# Patient Record
Sex: Male | Born: 1985 | Race: White | Hispanic: No | Smoking: Current every day smoker
Health system: Southern US, Community
[De-identification: ages and names within clinical notes are randomized; demographics above are authoritative.]

## PROBLEM LIST (undated history)

## (undated) DIAGNOSIS — F1010 Alcohol abuse, uncomplicated: Secondary | ICD-10-CM

## (undated) DIAGNOSIS — I214 Non-ST elevation (NSTEMI) myocardial infarction: Secondary | ICD-10-CM

## (undated) DIAGNOSIS — I5020 Unspecified systolic (congestive) heart failure: Secondary | ICD-10-CM

## (undated) HISTORY — PX: CARDIAC CATHETERIZATION: SHX172

## (undated) HISTORY — DX: Non-ST elevation (NSTEMI) myocardial infarction: I21.4

## (undated) HISTORY — DX: Unspecified systolic (congestive) heart failure: I50.20

---
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
## 2006-08-11 ENCOUNTER — Emergency Department (HOSPITAL_COMMUNITY): Admission: EM | Admit: 2006-08-11 | Discharge: 2006-08-11 | Payer: Self-pay | Admitting: Emergency Medicine

## 2007-06-29 ENCOUNTER — Emergency Department (HOSPITAL_COMMUNITY): Admission: EM | Admit: 2007-06-29 | Discharge: 2007-06-29 | Payer: Self-pay | Admitting: Emergency Medicine

## 2007-12-26 ENCOUNTER — Emergency Department (HOSPITAL_COMMUNITY): Admission: EM | Admit: 2007-12-26 | Discharge: 2007-12-26 | Payer: Self-pay | Admitting: Emergency Medicine

## 2009-07-18 ENCOUNTER — Emergency Department (HOSPITAL_COMMUNITY): Admission: EM | Admit: 2009-07-18 | Discharge: 2009-07-18 | Payer: Self-pay | Admitting: Emergency Medicine

## 2009-08-19 ENCOUNTER — Emergency Department (HOSPITAL_COMMUNITY): Admission: EM | Admit: 2009-08-19 | Discharge: 2009-08-19 | Payer: Self-pay | Admitting: Emergency Medicine

## 2009-09-07 ENCOUNTER — Ambulatory Visit: Payer: Self-pay | Admitting: Diagnostic Radiology

## 2009-09-07 ENCOUNTER — Emergency Department (HOSPITAL_BASED_OUTPATIENT_CLINIC_OR_DEPARTMENT_OTHER): Admission: EM | Admit: 2009-09-07 | Discharge: 2009-09-07 | Payer: Self-pay | Admitting: Emergency Medicine

## 2009-11-08 ENCOUNTER — Emergency Department (HOSPITAL_BASED_OUTPATIENT_CLINIC_OR_DEPARTMENT_OTHER): Admission: EM | Admit: 2009-11-08 | Discharge: 2009-11-08 | Payer: Self-pay | Admitting: Emergency Medicine

## 2009-12-14 ENCOUNTER — Emergency Department (HOSPITAL_BASED_OUTPATIENT_CLINIC_OR_DEPARTMENT_OTHER): Admission: EM | Admit: 2009-12-14 | Discharge: 2009-12-14 | Payer: Self-pay | Admitting: Emergency Medicine

## 2011-09-23 LAB — CULTURE, ROUTINE-ABSCESS

## 2014-12-09 ENCOUNTER — Encounter (HOSPITAL_COMMUNITY): Payer: Self-pay | Admitting: *Deleted

## 2014-12-09 ENCOUNTER — Emergency Department (HOSPITAL_COMMUNITY)
Admission: EM | Admit: 2014-12-09 | Discharge: 2014-12-09 | Disposition: A | Discharge status: Home / Self Care | Payer: Self-pay | Attending: Emergency Medicine | Admitting: Emergency Medicine

## 2014-12-09 DIAGNOSIS — K029 Dental caries, unspecified: Secondary | ICD-10-CM | POA: Insufficient documentation

## 2014-12-09 DIAGNOSIS — K088 Other specified disorders of teeth and supporting structures: Secondary | ICD-10-CM | POA: Insufficient documentation

## 2014-12-09 DIAGNOSIS — Z791 Long term (current) use of non-steroidal anti-inflammatories (NSAID): Secondary | ICD-10-CM | POA: Insufficient documentation

## 2014-12-09 DIAGNOSIS — Z720 Tobacco use: Secondary | ICD-10-CM | POA: Insufficient documentation

## 2014-12-09 DIAGNOSIS — Z792 Long term (current) use of antibiotics: Secondary | ICD-10-CM | POA: Insufficient documentation

## 2014-12-09 DIAGNOSIS — K0889 Other specified disorders of teeth and supporting structures: Secondary | ICD-10-CM

## 2014-12-09 MED ORDER — PENICILLIN V POTASSIUM 250 MG PO TABS
500.0000 mg | ORAL_TABLET | Freq: Once | ORAL | Status: AC
  Administered 2014-12-09: 500 mg via ORAL
  Filled 2014-12-09: qty 2

## 2014-12-09 MED ORDER — IBUPROFEN 800 MG PO TABS
800.0000 mg | ORAL_TABLET | Freq: Once | ORAL | Status: AC
  Administered 2014-12-09: 800 mg via ORAL
  Filled 2014-12-09: qty 1

## 2014-12-09 MED ORDER — PENICILLIN V POTASSIUM 500 MG PO TABS: 500.0000 mg | ORAL_TABLET | Freq: Four times a day (QID) | ORAL | Status: AC

## 2014-12-09 MED ORDER — NAPROXEN 500 MG PO TABS: 500.0000 mg | ORAL_TABLET | Freq: Two times a day (BID) | ORAL | Status: DC

## 2014-12-09 NOTE — ED Notes (Signed)
Pt reports pain from broken tooth, rt upper back and rt lower back. Pt states tried waiting until Feb when dental insurance starts, unable to tolerate pain any longer.

## 2014-12-09 NOTE — ED Provider Notes (Signed)
CSN: [id number]     Arrival date & time 12/09/14  6644 History   This chart was scribed for Mathew Octave, MD by Ronney Lion, ED Scribe. This patient was seen in room [facility]17/[facility]17 and the patient's care was started at 9:50 AM.    Chief Complaint  Patient presents with  . Dental Pain    The history is provided by the patient. No language interpreter was used.     HPI Comments: Mathew Palmer is a 29 y.o. male who presents to the Emergency Department complaining of dental pain on his bottom right cracked tooth and upper right tooth with a cap that began several days ago. Patient also complains of slight nausea. He has tried a saltwater rinse and ibuprofen with minimal relief. He denies health problems. He denies fever, vomiting, SOB, and difficulty swallowing. Patient has a dentist he plans to see in 1 month; last seen 1.5 years ago. He has no known allergies.   History reviewed. No pertinent past medical history. History reviewed. No pertinent past surgical history. History reviewed. No pertinent family history. History  Substance Use Topics  . Smoking status: Current Every Day Smoker -- 1.00 packs/day    Types: Cigarettes  . Smokeless tobacco: Not on file  . Alcohol Use: No    Review of Systems  A complete 10 system review of systems was obtained and all systems are negative except as noted in the HPI and PMH.    Allergies  Review of patient's allergies indicates no known allergies.  Home Medications   Prior to Admission medications   Medication Sig Start Date End Date Taking? Authorizing Provider  naproxen (NAPROSYN) 500 MG tablet Take 1 tablet (500 mg total) by mouth 2 (two) times daily. 12/09/14   Mathew Octave, MD  penicillin v potassium (VEETID) 500 MG tablet Take 1 tablet (500 mg total) by mouth 4 (four) times daily. 12/09/14 12/16/14  Mathew Octave, MD   BP 143/89 mmHg  Pulse 68  Temp(Src) 97.5 F (36.4 C) (Oral)  Resp 17  Ht  (1.88 m)  Wt 240 lb (108.863 kg)   BMI 30.80 kg/m2  SpO2 97% Physical Exam  Constitutional: He is oriented to person, place, and time. He appears well-developed and well-nourished. No distress.  HENT:  Head: Normocephalic and atraumatic.  Mouth/Throat: Oropharynx is clear and moist. Dental caries present. No dental abscesses. No oropharyngeal exudate.  Large caries to left and right lower third molars. Missing right second molar. Cap to right upper third molar. Floor of mouth is soft, no abscess.   Eyes: Conjunctivae and EOM are normal. Pupils are equal, round, and reactive to light.  Neck: Normal range of motion. Neck supple.  No meningismus.  Cardiovascular: Normal rate, regular rhythm, normal heart sounds and intact distal pulses.   No murmur heard. Pulmonary/Chest: Effort normal and breath sounds normal. No respiratory distress.  Abdominal: Soft. There is no tenderness. There is no rebound and no guarding.  Musculoskeletal: Normal range of motion. He exhibits no edema or tenderness.  Neurological: He is alert and oriented to person, place, and time. No cranial nerve deficit. He exhibits normal muscle tone. Coordination normal.  No ataxia on finger to nose bilaterally. No pronator drift. 5/5 strength throughout. CN 2-12 intact. Negative Romberg. Equal grip strength. Sensation intact. Gait is normal.   Skin: Skin is warm.  Psychiatric: He has a normal mood and affect. His behavior is normal.  Nursing note and vitals reviewed.   ED Course  Procedures (including critical care time)  DIAGNOSTIC STUDIES: Oxygen Saturation is 97% on room air, normal by my interpretation.    COORDINATION OF CARE: 9:53 AM - Discussed treatment plan with pt at bedside which includes antibiotics and pain medication and pt agreed to plan.   Labs Review Labs Reviewed - No data to display  Imaging Review No results found.   EKG Interpretation None      MDM   Final diagnoses:  Pain, dental    Right lower dental pain for the past  2 days. No difficulty breathing or swallowing.  Exam with multiple broken teeth. No abscess, no ludwig's angina.  Pain control, antibiotics, follow up with dentistry.  I personally performed the services described in this documentation, which was scribed in my presence. The recorded information has been reviewed and is accurate.    Mathew Octave, MD 12/09/14 [phone]

## 2014-12-09 NOTE — ED Notes (Signed)
Pt alert & oriented x4, stable gait. Patient given discharge instructions, paperwork & prescription(s). Patient  instructed to stop at the registration desk to finish any additional paperwork. Patient verbalized understanding. Pt left department w/ no further questions. 

## 2014-12-09 NOTE — Discharge Instructions (Signed)
Dental Pain Follow up with your dentist as soon as possible. Return to the ED if you develop worsening symptoms. A tooth ache may be caused by cavities (tooth decay). Cavities expose the nerve of the tooth to air and hot or cold temperatures. It may come from an infection or abscess (also called a boil or furuncle) around your tooth. It is also often caused by dental caries (tooth decay). This causes the pain you are having. DIAGNOSIS  Your caregiver can diagnose this problem by exam. TREATMENT   If caused by an infection, it may be treated with medications which kill germs (antibiotics) and pain medications as prescribed by your caregiver. Take medications as directed.  Only take over-the-counter or prescription medicines for pain, discomfort, or fever as directed by your caregiver.  Whether the tooth ache today is caused by infection or dental disease, you should see your dentist as soon as possible for further care. SEEK MEDICAL CARE IF: The exam and treatment you received today has been provided on an emergency basis only. This is not a substitute for complete medical or dental care. If your problem worsens or new problems (symptoms) appear, and you are unable to meet with your dentist, call or return to this location. SEEK IMMEDIATE MEDICAL CARE IF:   You have a fever.  You develop redness and swelling of your face, jaw, or neck.  You are unable to open your mouth.  You have severe pain uncontrolled by pain medicine. MAKE SURE YOU:   Understand these instructions.  Will watch your condition.  Will get help right away if you are not doing well or get worse. Document Released: 11/25/2005 Document Revised: 02/17/2012 Document Reviewed: 07/13/2008 [facility] Patient Information 2015 [place], [place]. This information is not intended to replace advice given to you by your health care provider. Make sure you discuss any questions you have with your health care provider.

## 2016-09-04 ENCOUNTER — Encounter (HOSPITAL_COMMUNITY): Payer: Self-pay

## 2016-09-04 ENCOUNTER — Emergency Department (HOSPITAL_COMMUNITY)
Admission: EM | Admit: 2016-09-04 | Discharge: 2016-09-04 | Disposition: A | Discharge status: Home / Self Care | Payer: Self-pay | Attending: Emergency Medicine | Admitting: Emergency Medicine

## 2016-09-04 DIAGNOSIS — L03114 Cellulitis of left upper limb: Secondary | ICD-10-CM | POA: Insufficient documentation

## 2016-09-04 DIAGNOSIS — Z791 Long term (current) use of non-steroidal anti-inflammatories (NSAID): Secondary | ICD-10-CM | POA: Insufficient documentation

## 2016-09-04 DIAGNOSIS — L0390 Cellulitis, unspecified: Secondary | ICD-10-CM

## 2016-09-04 DIAGNOSIS — L0291 Cutaneous abscess, unspecified: Secondary | ICD-10-CM

## 2016-09-04 DIAGNOSIS — F17210 Nicotine dependence, cigarettes, uncomplicated: Secondary | ICD-10-CM | POA: Insufficient documentation

## 2016-09-04 DIAGNOSIS — Z79899 Other long term (current) drug therapy: Secondary | ICD-10-CM | POA: Insufficient documentation

## 2016-09-04 DIAGNOSIS — L02414 Cutaneous abscess of left upper limb: Secondary | ICD-10-CM | POA: Insufficient documentation

## 2016-09-04 LAB — CBC WITH DIFFERENTIAL/PLATELET
BASOS PCT: 0 %
Basophils Absolute: 0 10*3/uL (ref 0.0–0.1)
EOS ABS: 0.2 10*3/uL (ref 0.0–0.7)
Eosinophils Relative: 2 %
HEMATOCRIT: 43.5 % (ref 39.0–52.0)
Hemoglobin: 14.9 g/dL (ref 13.0–17.0)
LYMPHS ABS: 2.1 10*3/uL (ref 0.7–4.0)
Lymphocytes Relative: 22 %
MCH: 34 pg (ref 26.0–34.0)
MCHC: 34.3 g/dL (ref 30.0–36.0)
MCV: 99.3 fL (ref 78.0–100.0)
MONO ABS: 0.9 10*3/uL (ref 0.1–1.0)
MONOS PCT: 9 %
Neutro Abs: 6.4 10*3/uL (ref 1.7–7.7)
Neutrophils Relative %: 67 %
Platelets: 203 10*3/uL (ref 150–400)
RBC: 4.38 MIL/uL (ref 4.22–5.81)
RDW: 12.7 % (ref 11.5–15.5)
WBC: 9.5 10*3/uL (ref 4.0–10.5)

## 2016-09-04 LAB — BASIC METABOLIC PANEL
Anion gap: 2 — ABNORMAL LOW (ref 5–15)
BUN: 8 mg/dL (ref 6–20)
CALCIUM: 8.6 mg/dL — AB (ref 8.9–10.3)
CHLORIDE: 111 mmol/L (ref 101–111)
CO2: 24 mmol/L (ref 22–32)
CREATININE: 1.12 mg/dL (ref 0.61–1.24)
GFR calc non Af Amer: >60 mL/min (ref >60)
Glucose, Bld: 93 mg/dL (ref 65–99)
Potassium: 3.8 mmol/L (ref 3.5–5.1)
SODIUM: 137 mmol/L (ref 135–145)

## 2016-09-04 MED ORDER — CEPHALEXIN 500 MG PO CAPS
500.0000 mg | ORAL_CAPSULE | Freq: Once | ORAL | Status: AC
  Administered 2016-09-04: 500 mg via ORAL
  Filled 2016-09-04: qty 1

## 2016-09-04 MED ORDER — LIDOCAINE-EPINEPHRINE (PF) 2 %-1:200000 IJ SOLN
20.0000 mL | Freq: Once | INTRAMUSCULAR | Status: AC
  Administered 2016-09-04: 20 mL
  Filled 2016-09-04: qty 20

## 2016-09-04 MED ORDER — LIDOCAINE-EPINEPHRINE-TETRACAINE (LET) SOLUTION
3.0000 mL | Freq: Once | NASAL | Status: AC
  Administered 2016-09-04: 3 mL via TOPICAL
  Filled 2016-09-04: qty 3

## 2016-09-04 MED ORDER — SULFAMETHOXAZOLE-TRIMETHOPRIM 800-160 MG PO TABS: 1.0000 | ORAL_TABLET | Freq: Two times a day (BID) | ORAL | 0 refills | Status: AC

## 2016-09-04 MED ORDER — CEPHALEXIN 500 MG PO CAPS: 500.0000 mg | ORAL_CAPSULE | Freq: Four times a day (QID) | ORAL | 0 refills | Status: DC

## 2016-09-04 MED ORDER — SULFAMETHOXAZOLE-TRIMETHOPRIM 800-160 MG PO TABS
1.0000 | ORAL_TABLET | Freq: Once | ORAL | Status: AC
  Administered 2016-09-04: 1 via ORAL
  Filled 2016-09-04: qty 1

## 2016-09-04 NOTE — ED Provider Notes (Signed)
[facility]-EMERGENCY DEPT Provider Note   CSN: [id number] Arrival date & time: 09/04/16  1821     History   Chief Complaint Chief Complaint  Patient presents with  . Abscess    HPI Mathew Palmer is a 30 y.o. male.  HPI 30 year old male who presents with concern for skin infection. He is otherwise healthy and not immunosuppressed. States about a week ago he noticed a bug bite versus ingrown hair over the left  proximal forearm. Over the course of the past week he has had gradual increased swelling and redness down the forearm to the wrist. No fevers or chills, nausea or vomiting. Has noted some purulent drainage from his wound this morning. Has been taking penicillin for dental infection and ibuprofen for pain with some relief in his pain  History reviewed. No pertinent past medical history.  There are no active problems to display for this patient.   History reviewed. No pertinent surgical history.     Home Medications    Prior to Admission medications   Medication Sig Start Date End Date Taking? Authorizing Provider  ibuprofen (ADVIL,MOTRIN) 600 MG tablet Take 600 mg by mouth every 6 (six) hours as needed for mild pain or moderate pain.   Yes Historical Provider, MD  penicillin v potassium (VEETID) 500 MG tablet Take 500 mg by mouth 4 (four) times daily.   Yes Historical Provider, MD  cephALEXin (KEFLEX) 500 MG capsule Take 1 capsule (500 mg total) by mouth 4 (four) times daily. 09/04/16   Lavera Guise, MD  sulfamethoxazole-trimethoprim (BACTRIM DS,SEPTRA DS) 800-160 MG tablet Take 1 tablet by mouth 2 (two) times daily. 09/04/16 09/11/16  Lavera Guise, MD    Family History No family history on file.  Social History Social History  Substance Use Topics  . Smoking status: Current Every Day Smoker    Packs/day: 1.00    Types: Cigarettes  . Smokeless tobacco: Never Used  . Alcohol use No     Allergies   Review of patient's allergies indicates no known  allergies.   Review of Systems Review of Systems 10/14 systems reviewed and are negative other than those stated in the HPI   Physical Exam Updated Vital Signs BP 100/85 (BP Location: Left Arm)   Pulse 78   Temp 98 F (36.7 C) (Oral)   Resp 20   Ht 6\' 2"  (1.88 m)   Wt 208 lb (94.3 kg)   SpO2 98%   BMI 26.71 kg/m   Physical Exam Physical Exam  Nursing note and vitals reviewed. Constitutional: Well developed, well nourished, non-toxic, and in no acute distress Head: Normocephalic and atraumatic.  Mouth/Throat: Oropharynx is clear and moist.  Neck: Normal range of motion. Neck supple.  Cardiovascular: Normal rate and regular rhythm.   Pulmonary/Chest: Effort normal and breath sounds normal.  Abdominal: Soft. There is no tenderness. There is no rebound and no guarding.  Musculoskeletal: Normal range of motion.  Neurological: Alert, no facial droop, fluent speech, moves all extremities symmetrically Skin: Skin is warm and dry. 3.x 3 ulcerated open area of skin just distal to elbow over proximal forearm w/o drainage, surrounding erythema and warmth down forearm Psychiatric: Cooperative   ED Treatments / Results  Labs (all labs ordered are listed, but only abnormal results are displayed) Labs Reviewed  BASIC METABOLIC PANEL - Abnormal; Notable for the following:       Result Value   Calcium 8.6 (*)    Anion gap 2 (*)  All other components within normal limits  CBC WITH DIFFERENTIAL/PLATELET    EKG  EKG Interpretation None       Radiology No results found.  Procedures .Marland Kitchen.Incision and Drainage Date/Time: 09/04/2016 9:10 PM Performed by: Crista CurbLIU, Donta Fuster DUO Authorized by: Crista CurbLIU, Tanai Bouler DUO   Consent:    Consent obtained:  Verbal   Consent given by:  Patient   Risks discussed:  Bleeding, incomplete drainage, pain and infection   Alternatives discussed:  No treatment Location:    Type:  Abscess   Size:  3 x 3 cm   Location:  Upper extremity   Upper extremity  location:  Arm   Arm location:  L lower arm Pre-procedure details:    Skin preparation:  Betadine Anesthesia (see MAR for exact dosages):    Anesthesia method:  None Procedure type:    Complexity:  Simple Procedure details:    Needle aspiration: no     Incision types:  Single straight   Incision depth:  Submucosal   Scalpel blade:  11   Wound management:  Probed and deloculated, irrigated with saline and debrided   Drainage:  Bloody and purulent   Drainage amount:  Moderate   Wound treatment:  Wound left open   Packing materials:  None Post-procedure details:    Patient tolerance of procedure:  Tolerated well, no immediate complications   (including critical care time)  EMERGENCY DEPARTMENT US SOFT TISSUE INTERPRETATION "Study: Limited Ultrasound of the noted body part in comments below"  INDICATIONS: Soft tissue infection Multiple views of the body part are obtained with a multi-frequency linear probe  PERFORMED BY:  Myself  IMAGES ARCHIVED?: No  SIDE:Left  BODY PART:Upper extremity  FINDINGS: Abcess present and Cellulitis present  LIMITATIONS:  Body Habitus  INTERPRETATION:  Abcess present and Cellulitis present  COMMENT:  n/a    Medications Ordered in ED Medications  sulfamethoxazole-trimethoprim (BACTRIM DS,SEPTRA DS) 800-160 MG per tablet 1 tablet (not administered)  cephALEXin (KEFLEX) capsule 500 mg (not administered)  lidocaine-EPINEPHrine-tetracaine (LET) solution (3 mLs Topical Given 09/04/16 2035)  lidocaine-EPINEPHrine (XYLOCAINE W/EPI) 2 %-1:200000 (PF) injection 20 mL (20 mLs Infiltration Given 09/04/16 2035)     Initial Impression / Assessment and Plan / ED Course  I have reviewed the triage vital signs and the nursing notes.  Pertinent labs & imaging results that were available during my care of the patient were reviewed by me and considered in my medical decision making (see chart for details).  Clinical Course    Presenting with  cellulitis and abscess of the left forearm.  abscess confirmed under ultrasound and with associating cellulitis. Without systemic signs of illness and no leukocytosis on blood work. He is not immunosuppressed. I&D performed under ultrasound guidance. I feel that he is good for outpatient management. Prescribed Keflex and Bactrim. Strict return and follow-up instructions reviewed. He expressed understanding of all discharge instructions and felt comfortable with the plan of care.  Final Clinical Impressions(s) / ED Diagnoses   Final diagnoses:  Abscess  Abscess and cellulitis    New Prescriptions New Prescriptions   CEPHALEXIN (KEFLEX) 500 MG CAPSULE    Take 1 capsule (500 mg total) by mouth 4 (four) times daily.   SULFAMETHOXAZOLE-TRIMETHOPRIM (BACTRIM DS,SEPTRA DS) 800-160 MG TABLET    Take 1 tablet by mouth 2 (two) times daily.     Lavera Guiseana Duo Stayce Delancy, MD 09/04/16 2112

## 2016-09-04 NOTE — ED Triage Notes (Signed)
Patient reports of possible insect bite last Wednesday. Swelling and redness noted from elbow to wrist.

## 2016-09-04 NOTE — Discharge Instructions (Signed)
Take antibiotics as prescribed.  Return for worsening symptoms, including worsening redness/swelling, fevers despite antibiotics, escalating pain or any other symptoms concerning to you.  Continue ibuprofen and tylenol for pain control.

## 2017-09-12 ENCOUNTER — Encounter (HOSPITAL_COMMUNITY): Payer: Self-pay

## 2017-09-12 ENCOUNTER — Emergency Department (HOSPITAL_COMMUNITY): Payer: BLUE CROSS/BLUE SHIELD

## 2017-09-12 ENCOUNTER — Emergency Department (HOSPITAL_COMMUNITY)
Admission: EM | Admit: 2017-09-12 | Discharge: 2017-09-12 | Disposition: A | Discharge status: Home / Self Care | Payer: BLUE CROSS/BLUE SHIELD | Attending: Emergency Medicine | Admitting: Emergency Medicine

## 2017-09-12 DIAGNOSIS — K920 Hematemesis: Secondary | ICD-10-CM | POA: Insufficient documentation

## 2017-09-12 DIAGNOSIS — J029 Acute pharyngitis, unspecified: Secondary | ICD-10-CM | POA: Diagnosis present

## 2017-09-12 DIAGNOSIS — R112 Nausea with vomiting, unspecified: Secondary | ICD-10-CM | POA: Insufficient documentation

## 2017-09-12 DIAGNOSIS — F17210 Nicotine dependence, cigarettes, uncomplicated: Secondary | ICD-10-CM | POA: Diagnosis not present

## 2017-09-12 DIAGNOSIS — F1010 Alcohol abuse, uncomplicated: Secondary | ICD-10-CM | POA: Insufficient documentation

## 2017-09-12 HISTORY — DX: Alcohol abuse, uncomplicated: F10.10

## 2017-09-12 MED ORDER — ONDANSETRON 4 MG PO TBDP
4.0000 mg | ORAL_TABLET | Freq: Once | ORAL | Status: AC
  Administered 2017-09-12: 4 mg via ORAL
  Filled 2017-09-12: qty 1

## 2017-09-12 MED ORDER — ONDANSETRON 4 MG PO TBDP: 4.0000 mg | ORAL_TABLET | Freq: Three times a day (TID) | ORAL | 0 refills | Status: DC | PRN

## 2017-09-12 NOTE — ED Provider Notes (Signed)
Emergency Department Provider Note   I have reviewed the triage vital signs and the nursing notes.   HISTORY  Chief Complaint throat swelling   HPI Mathew Palmer is a 31 y.o. male with PMH of EtOH abuse presents to the emergency department for evaluation of throat discomfort after multiple episodes of vomiting. The patient initially presented to an outside emergency department for evaluation of vomiting up blood. Patient states that he was in the emergency pertinent for several hours overnight where labs, chest x-ray, and IV fluids were given. He states his vomiting has stopped. He initially had blood in his vomit which improved overnight. He denies any prior history of vomiting blood. No known history of liver cirrhosis or esophageal varices. He denies any pain in his chest or difficulty breathing. Patient arrives to this emergency department with print out of lab work performed just hours prior. His main complaint at this time is sensation of pain in the throat. No radiation of symptoms. No modifying factors.   Past Medical History:  Diagnosis Date  . ETOH abuse     There are no active problems to display for this patient.   History reviewed. No pertinent surgical history.  Current Outpatient Rx  . Order #: [id number] Class: Print  . Order #: [id number] Class: Historical Med  . Order #: [id number] Class: Print  . Order #: [id number] Class: Historical Med    Allergies Patient has no known allergies.  No family history on file.  Social History Social History  Substance Use Topics  . Smoking status: Current Every Day Smoker    Packs/day: 1.00    Types: Cigarettes  . Smokeless tobacco: Never Used  . Alcohol use Yes     Comment: heavily daily, now "beer or 2 here and there."    Review of Systems  Constitutional: No fever/chills Eyes: No visual changes. ENT: Positive sore throat.  Cardiovascular: Denies chest pain. Respiratory: Denies shortness of breath. Gastrointestinal:  No abdominal pain. Positive nausea and vomiting.  No diarrhea.  No constipation. Genitourinary: Negative for dysuria. Musculoskeletal: Negative for back pain. Skin: Negative for rash. Neurological: Negative for headaches, focal weakness or numbness.  10-point ROS otherwise negative.  ____________________________________________   PHYSICAL EXAM:  VITAL SIGNS: ED Triage Vitals  Enc Vitals Group     BP 09/12/17 0753 139/86     Pulse Rate 09/12/17 0753 60     Resp 09/12/17 0753 18     Temp 09/12/17 0753 (!) 97.5 F (36.4 C)     Temp Source 09/12/17 0753 Oral     SpO2 09/12/17 0753 93 %     Weight 09/12/17 0751 214 lb (97.1 kg)     Height 09/12/17 0751 6' 3"  (1.905 m)   Constitutional: Alert and oriented. Well appearing and in no acute distress. Eyes: Conjunctivae are normal.  Head: Atraumatic. Nose: No congestion/rhinnorhea. Mouth/Throat: Mucous membranes are moist.  Oropharynx non-erythematous. Managing oral secretions. No muffled voice. No respiratory distress.  Neck: No stridor.   Cardiovascular: Normal rate, regular rhythm. Good peripheral circulation. Grossly normal heart sounds.   Respiratory: Normal respiratory effort.  No retractions. Lungs CTAB. Gastrointestinal: Soft and nontender. No distention.  Musculoskeletal: No lower extremity tenderness nor edema. No gross deformities of extremities. Neurologic:  Normal speech and language. No gross focal neurologic deficits are appreciated.  Skin:  Skin is warm, dry and intact. No rash noted. ____________________________________________  RADIOLOGY  Dg Neck Soft Tissue  Result Date: 09/12/2017 CLINICAL DATA:  Right sided throat swelling and difficulty  swallowing EXAM: NECK SOFT TISSUES - 1+ VIEW COMPARISON:  None. FINDINGS: No prevertebral soft tissue changes are seen. Epiglottis is mildly prominent although this may be projectional in nature clinical correlation is recommended. Dental caries are seen. No other soft tissue  abnormality is noted. No radiopaque foreign body is seen. IMPRESSION: No foreign body noted. Mild prominence of the epiglottis. Need for direct visualization can be determined on a clinical basis. Electronically Signed   By: Inez Catalina M.D.   On: 09/12/2017 08:49   Dg Abdomen Acute W/chest  Result Date: 09/12/2017 CLINICAL DATA:  Right-sided throat swelling and difficulty swallowing EXAM: DG ABDOMEN ACUTE W/ 1V CHEST COMPARISON:  09/11/2017 FINDINGS: Cardiac shadows within normal limits. The lungs are well aerated bilaterally. No focal infiltrate or sizable effusion is seen. Scattered large and small bowel gas is noted within the abdomen. No free air is seen. No abnormal mass or abnormal calcifications are noted. No acute bony abnormality is seen. IMPRESSION: No acute abnormality noted.  No change from the previous day. Electronically Signed   By: Inez Catalina M.D.   On: 09/12/2017 08:48    ____________________________________________   PROCEDURES  Procedure(s) performed:   Procedures  None ____________________________________________   INITIAL IMPRESSION / ASSESSMENT AND PLAN / ED COURSE  Pertinent labs & imaging results that were available during my care of the patient were reviewed by me and considered in my medical decision making (see chart for details).  Patient is a well-appearing 31 year old male with history of alcohol abuse who presents with throat discomfort. He was seen at an outside emergency department last night with symptoms of vomiting blood. He is feeling much better after IV fluids and nausea control at the outside emergency department. He had blood work drawn which I'm able to review both at the bedside. Normal H/H. Slight elevation and Alk phos and mild elevation in liver enzymes. Reportedly did CXR but report not available to me. Patient is very well-appearing at this time with widely patent airway and apparent resolution of bloody emesis and nausea. He has no  residual chest pain. I do not see an indication to repeat lab work at this time. Plan for chest x-ray here to evaluate the mediastinal contours but had very low suspicion for esophageal rupture. Patient is hemodynamically stable. Plan to also obtain plain film of the neck with extremely low suspicion for significant swelling in that area. I suspect that his throat is rotated and sore from vomiting stomach acid.   08:58 AM Reviewed the patient's imaging. There is some mild swelling of the epiglottis that may be projectional. Clinically, the patient is awake and alert. He sitting up on the edge the bed and speaking in clear voice. He has no stridor. He is managing oral secretions. There may be some mild inflammation from vomiting but after review of the plain film and do not feel that direct visualization is warranted at this time. Plan for supportive care and symptom management at home. Discussed strict return precautions in detail but the patient and his wife. I will add Zofran along with the omeprazole prescribed by the prior ED.   At this time, I do not feel there is any life-threatening condition present. I have reviewed and discussed all results (EKG, imaging, lab, urine as appropriate), exam findings with patient. I have reviewed nursing notes and appropriate previous records.  I feel the patient is safe to be discharged home without further emergent workup. Discussed usual and customary return precautions. Patient and  family (if present) verbalize understanding and are comfortable with this plan.  Patient will follow-up with their primary care provider. If they do not have a primary care provider, information for follow-up has been provided to them. All questions have been answered.  ____________________________________________  FINAL CLINICAL IMPRESSION(S) / ED DIAGNOSES  Final diagnoses:  Sore throat  Hematemesis with nausea     MEDICATIONS GIVEN DURING THIS VISIT:  Medications    ondansetron (ZOFRAN-ODT) disintegrating tablet 4 mg (4 mg Oral Given 09/12/17 0814)     NEW OUTPATIENT MEDICATIONS STARTED DURING THIS VISIT:  Discharge Medication List as of 09/12/2017  9:02 AM    START taking these medications   Details  ondansetron (ZOFRAN ODT) 4 MG disintegrating tablet Take 1 tablet (4 mg total) by mouth every 8 (eight) hours as needed for nausea or vomiting., Starting Fri 09/12/2017, Print        Note:  This document was prepared using Dragon voice recognition software and may include unintentional dictation errors.  Nanda Quinton, MD Emergency Medicine    Long, [facility], MD 09/12/17 [phone]

## 2017-09-12 NOTE — ED Triage Notes (Signed)
Pt reports started coughing up blood yesterday and went to [facility].  Reports has history of etoh abuse.  Pt says was diagnosed with cough, etoh abuse and given a prescription for omeprazole.  Pt says isn't coughing up blood today but feels like throat is swollen.   Last drank yesterday evening.  Reports had a 16oz beer.

## 2017-09-12 NOTE — Discharge Instructions (Signed)
You have been seen in the Emergency Department (ED) today for nausea and vomiting.  Your work up today has not shown a clear cause for your symptoms. You have been prescribed Zofran; please use as prescribed as needed for your nausea.  Return to the ED if you have sudden worsening sore throat, difficulty swallowing, difficulty breathing, or return of vomiting blood.   Follow up with your doctor as soon as possible regarding today?s emergent visit and your symptoms of nausea.   Return to the ED if you develop abdominal, bloody vomiting, bloody diarrhea, if you are unable to tolerate fluids due to vomiting, or if you develop other symptoms that concern you.

## 2020-09-29 ENCOUNTER — Other Ambulatory Visit: Payer: BLUE CROSS/BLUE SHIELD

## 2021-02-03 ENCOUNTER — Emergency Department (HOSPITAL_COMMUNITY): Payer: BC Managed Care – PPO

## 2021-02-03 ENCOUNTER — Other Ambulatory Visit: Payer: Self-pay

## 2021-02-03 ENCOUNTER — Emergency Department (HOSPITAL_COMMUNITY)
Admission: EM | Admit: 2021-02-03 | Discharge: 2021-02-03 | Disposition: A | Discharge status: Home / Self Care | Payer: BC Managed Care – PPO | Attending: Emergency Medicine | Admitting: Emergency Medicine

## 2021-02-03 ENCOUNTER — Encounter (HOSPITAL_COMMUNITY): Payer: Self-pay

## 2021-02-03 DIAGNOSIS — R0789 Other chest pain: Secondary | ICD-10-CM | POA: Diagnosis not present

## 2021-02-03 DIAGNOSIS — F17210 Nicotine dependence, cigarettes, uncomplicated: Secondary | ICD-10-CM | POA: Insufficient documentation

## 2021-02-03 LAB — CBC WITH DIFFERENTIAL/PLATELET
Abs Immature Granulocytes: 0.01 10*3/uL (ref 0.00–0.07)
Basophils Absolute: 0.1 10*3/uL (ref 0.0–0.1)
Basophils Relative: 1 %
Eosinophils Absolute: 0.2 10*3/uL (ref 0.0–0.5)
Eosinophils Relative: 2 %
HCT: 47.1 % (ref 39.0–52.0)
Hemoglobin: 16.3 g/dL (ref 13.0–17.0)
Immature Granulocytes: 0 %
Lymphocytes Relative: 23 %
Lymphs Abs: 2.2 10*3/uL (ref 0.7–4.0)
MCH: 34.6 pg — ABNORMAL HIGH (ref 26.0–34.0)
MCHC: 34.6 g/dL (ref 30.0–36.0)
MCV: 100 fL (ref 80.0–100.0)
Monocytes Absolute: 0.8 10*3/uL (ref 0.1–1.0)
Monocytes Relative: 8 %
Neutro Abs: 6.4 10*3/uL (ref 1.7–7.7)
Neutrophils Relative %: 66 %
Platelets: 183 10*3/uL (ref 150–400)
RBC: 4.71 MIL/uL (ref 4.22–5.81)
RDW: 12.1 % (ref 11.5–15.5)
WBC: 9.6 10*3/uL (ref 4.0–10.5)
nRBC: 0 % (ref 0.0–0.2)

## 2021-02-03 LAB — COMPREHENSIVE METABOLIC PANEL
ALT: 37 U/L (ref 0–44)
AST: 36 U/L (ref 15–41)
Albumin: 4.1 g/dL (ref 3.5–5.0)
Alkaline Phosphatase: 68 U/L (ref 38–126)
Anion gap: 10 (ref 5–15)
BUN: 9 mg/dL (ref 6–20)
CO2: 23 mmol/L (ref 22–32)
Calcium: 8.8 mg/dL — ABNORMAL LOW (ref 8.9–10.3)
Chloride: 105 mmol/L (ref 98–111)
Creatinine, Ser: 0.8 mg/dL (ref 0.61–1.24)
GFR, Estimated: >60 mL/min (ref >60)
Glucose, Bld: 95 mg/dL (ref 70–99)
Potassium: 3.6 mmol/L (ref 3.5–5.1)
Sodium: 138 mmol/L (ref 135–145)
Total Bilirubin: 0.4 mg/dL (ref 0.3–1.2)
Total Protein: 7.2 g/dL (ref 6.5–8.1)

## 2021-02-03 LAB — TROPONIN I (HIGH SENSITIVITY): Troponin I (High Sensitivity): <2 ng/L (ref <18)

## 2021-02-03 NOTE — Discharge Instructions (Addendum)
Like we discussed, you can take Tylenol and ibuprofen for management of your right chest wall pain.  Please follow the instructions on the bottle.  I think your symptoms today are likely musculoskeletal in nature.  If you feel that your symptoms worsen once again, you can always return to the emergency department for reevaluation.  If they persist, please follow-up at the health department.  It was a pleasure to meet you.

## 2021-02-03 NOTE — ED Provider Notes (Signed)
[facility] EMERGENCY DEPARTMENT Provider Note   CSN: [id number] Arrival date & time: 02/03/21  1947     History Chief Complaint  Patient presents with  . Chest Pain    Mathew Palmer is a 35 y.o. male.  HPI Patient is a 35 year old male who presents the emergency department due to chest tightness. Patient states his symptoms started about 2 days ago. States his pain was in the right chest. Describes it as an aching pain. Worsens with palpation. He states after about 10 minutes his pain resolved. No modifying factors. Afterwards he states that he was concerned that maybe he was having a recurrence of a prior dental infection so he took a remaining antibiotic for the past couple of days. While lying in bed this evening his pain started again and he became concerned so he came to the emergency department for further evaluation. Denies any shortness of breath, abdominal pain, nausea, vomiting, diaphoresis. Does note some mild lightheadedness when the pain occurs. No dizziness. He smokes 1 pack/day.    Past Medical History:  Diagnosis Date  . ETOH abuse     There are no problems to display for this patient.   History reviewed. No pertinent surgical history.     No family history on file.  Social History   Tobacco Use  . Smoking status: Current Every Day Smoker    Packs/day: 1.00    Types: Cigarettes  . Smokeless tobacco: Never Used  Substance Use Topics  . Alcohol use: Yes    Comment: "slowing down, trying to quit" 2-3 this morning  . Drug use: Yes    Types: Marijuana    Home Medications Prior to Admission medications   Medication Sig Start Date End Date Taking? Authorizing Provider  cephALEXin (KEFLEX) 500 MG capsule Take 1 capsule (500 mg total) by mouth 4 (four) times daily. 09/04/16   Lavera Guise, MD  ondansetron (ZOFRAN ODT) 4 MG disintegrating tablet Take 1 tablet (4 mg total) by mouth every 8 (eight) hours as needed for nausea or vomiting. 09/12/17   Long,  Arlyss Repress, MD    Allergies    Patient has no known allergies.  Review of Systems   Review of Systems  All other systems reviewed and are negative. Ten systems reviewed and are negative for acute change, except as noted in the HPI.    Physical Exam Updated Vital Signs BP (!) 134/97   Pulse 76   Temp 97.7 F (36.5 C) (Oral)   Resp 19   Ht 6\' 3"  (1.905 m)   Wt 104.3 kg   SpO2 98%   BMI 28.75 kg/m   Physical Exam Vitals and nursing note reviewed.  Constitutional:      General: He is not in acute distress.    Appearance: Normal appearance. He is well-developed and normal weight. He is not ill-appearing, toxic-appearing or diaphoretic.  HENT:     Head: Normocephalic and atraumatic.     Comments: Poor dentition. Multiple dental caries noted. No focal tenderness noted along the teeth or gums. No mandibular pain.    Right Ear: External ear normal.     Left Ear: External ear normal.     Nose: Nose normal.     Mouth/Throat:     Mouth: Mucous membranes are moist.     Pharynx: Oropharynx is clear. No oropharyngeal exudate or posterior oropharyngeal erythema.  Eyes:     Extraocular Movements: Extraocular movements intact.  Cardiovascular:     Rate and  Rhythm: Normal rate and regular rhythm.  No extrasystoles are present.    Pulses: Normal pulses.          Radial pulses are 2+ on the right side and 2+ on the left side.       Dorsalis pedis pulses are 2+ on the right side and 2+ on the left side.     Heart sounds: Normal heart sounds. Heart sounds not distant. No murmur heard.  No systolic murmur is present.  No diastolic murmur is present. No friction rub. No gallop.   Pulmonary:     Effort: Pulmonary effort is normal. No tachypnea, accessory muscle usage or respiratory distress.     Breath sounds: Normal breath sounds. No stridor. No decreased breath sounds, wheezing, rhonchi or rales.  Chest:     Chest wall: Tenderness present. No crepitus.     Comments: Mild right anterior  chest wall tenderness.  No crepitus. Abdominal:     General: Abdomen is flat.     Tenderness: There is no abdominal tenderness.  Musculoskeletal:        General: Normal range of motion.     Cervical back: Normal range of motion and neck supple. No tenderness.     Right lower leg: No tenderness. No edema.     Left lower leg: No tenderness. No edema.     Comments: No leg swelling. No calf tenderness.  Skin:    General: Skin is warm and dry.  Neurological:     General: No focal deficit present.     Mental Status: He is alert and oriented to person, place, and time.  Psychiatric:        Mood and Affect: Mood normal.        Behavior: Behavior normal.     ED Results / Procedures / Treatments   Labs (all labs ordered are listed, but only abnormal results are displayed) Labs Reviewed  COMPREHENSIVE METABOLIC PANEL - Abnormal; Notable for the following components:      Result Value   Calcium 8.8 (*)    All other components within normal limits  CBC WITH DIFFERENTIAL/PLATELET - Abnormal; Notable for the following components:   MCH 34.6 (*)    All other components within normal limits  TROPONIN I (HIGH SENSITIVITY)   EKG EKG Interpretation  Date/Time:  Saturday February 03 2021 20:07:13 EST Ventricular Rate:  80 PR Interval:    QRS Duration: 98 QT Interval:  400 QTC Calculation: 462 R Axis:   66 Text Interpretation: Sinus rhythm Normal ECG No old tracing to compare Confirmed by Eber Hong ([id number]) on 02/03/2021 8:20:06 PM   Radiology DG Chest Portable 1 View  Result Date: 02/03/2021 CLINICAL DATA:  Intermittent chest pain and tightness over the last 2 days. EXAM: PORTABLE CHEST 1 VIEW COMPARISON:  08/11/2006 FINDINGS: Artifact overlies the chest. Allowing for that, the heart and mediastinal shadows are normal. The lungs are clear. The vascularity is normal. No effusions. No significant bone finding. IMPRESSION: No active disease.  Overlying artifact. Electronically Signed    By: Paulina Fusi M.D.   On: 02/03/2021 20:38    Procedures Procedures   Medications Ordered in ED Medications - No data to display  ED Course  I have reviewed the triage vital signs and the nursing notes.  Pertinent labs & imaging results that were available during my care of the patient were reviewed by me and considered in my medical decision making (see chart for details).    MDM Rules/Calculators/A&P  Pt is a 35 y.o. male who presents to the emergency department with right-sided chest pain.  Labs: CBC with an MCH of 34.6 CMP with a calcium of 8.8. Troponin less than 2.  Imaging: Chest x-ray is negative.  ECG: Normal sinus rhythm  I, Placido Sou, PA-C, personally reviewed and evaluated these images and lab results as part of my medical decision-making.  Patient presents today with 2 episodes of right-sided chest pain.  Pain reproducible on my exam.  No crepitus.  ECG is normal sinus rhythm.  Chest x-ray is negative.  Troponin less than 2.  Doubt ACS at this time.  Patient is PERC negative.  Discussed his symptoms likely being musculoskeletal in nature.  Patient seems extremely relieved.  Recommended OTC pain medications for management of his symptoms.  Return to the emergency department if his symptoms worsen.  His questions were answered and he was amicable at the time of discharge.  His vital signs are stable.  Note: Portions of this report may have been transcribed using voice recognition software. Every effort was made to ensure accuracy; however, inadvertent computerized transcription errors may be present.    Final Clinical Impression(s) / ED Diagnoses Final diagnoses:  Chest wall pain    Rx / DC Orders ED Discharge Orders    None       Placido Sou, PA-C 02/04/21 1328    Eber Hong, MD 02/04/21 [phone]

## 2021-02-03 NOTE — ED Triage Notes (Signed)
Pt reports intermittent chest tightness and dizziness for 2 days. Pt says he feels like his ears/head is "stopped up". Pt denies any other symptoms.

## 2021-07-17 IMAGING — DX DG CHEST 1V PORT
1 series · 2 of 2 positions shown · non-contrast
Comparison: 08/11/2006

CLINICAL DATA: Intermittent chest pain and tightness over the last
2 days.

EXAM:
PORTABLE CHEST 1 VIEW

[Series 1: chest ap · 0.14mm/px · 2 of 2 slices shown]
[im 1/2]
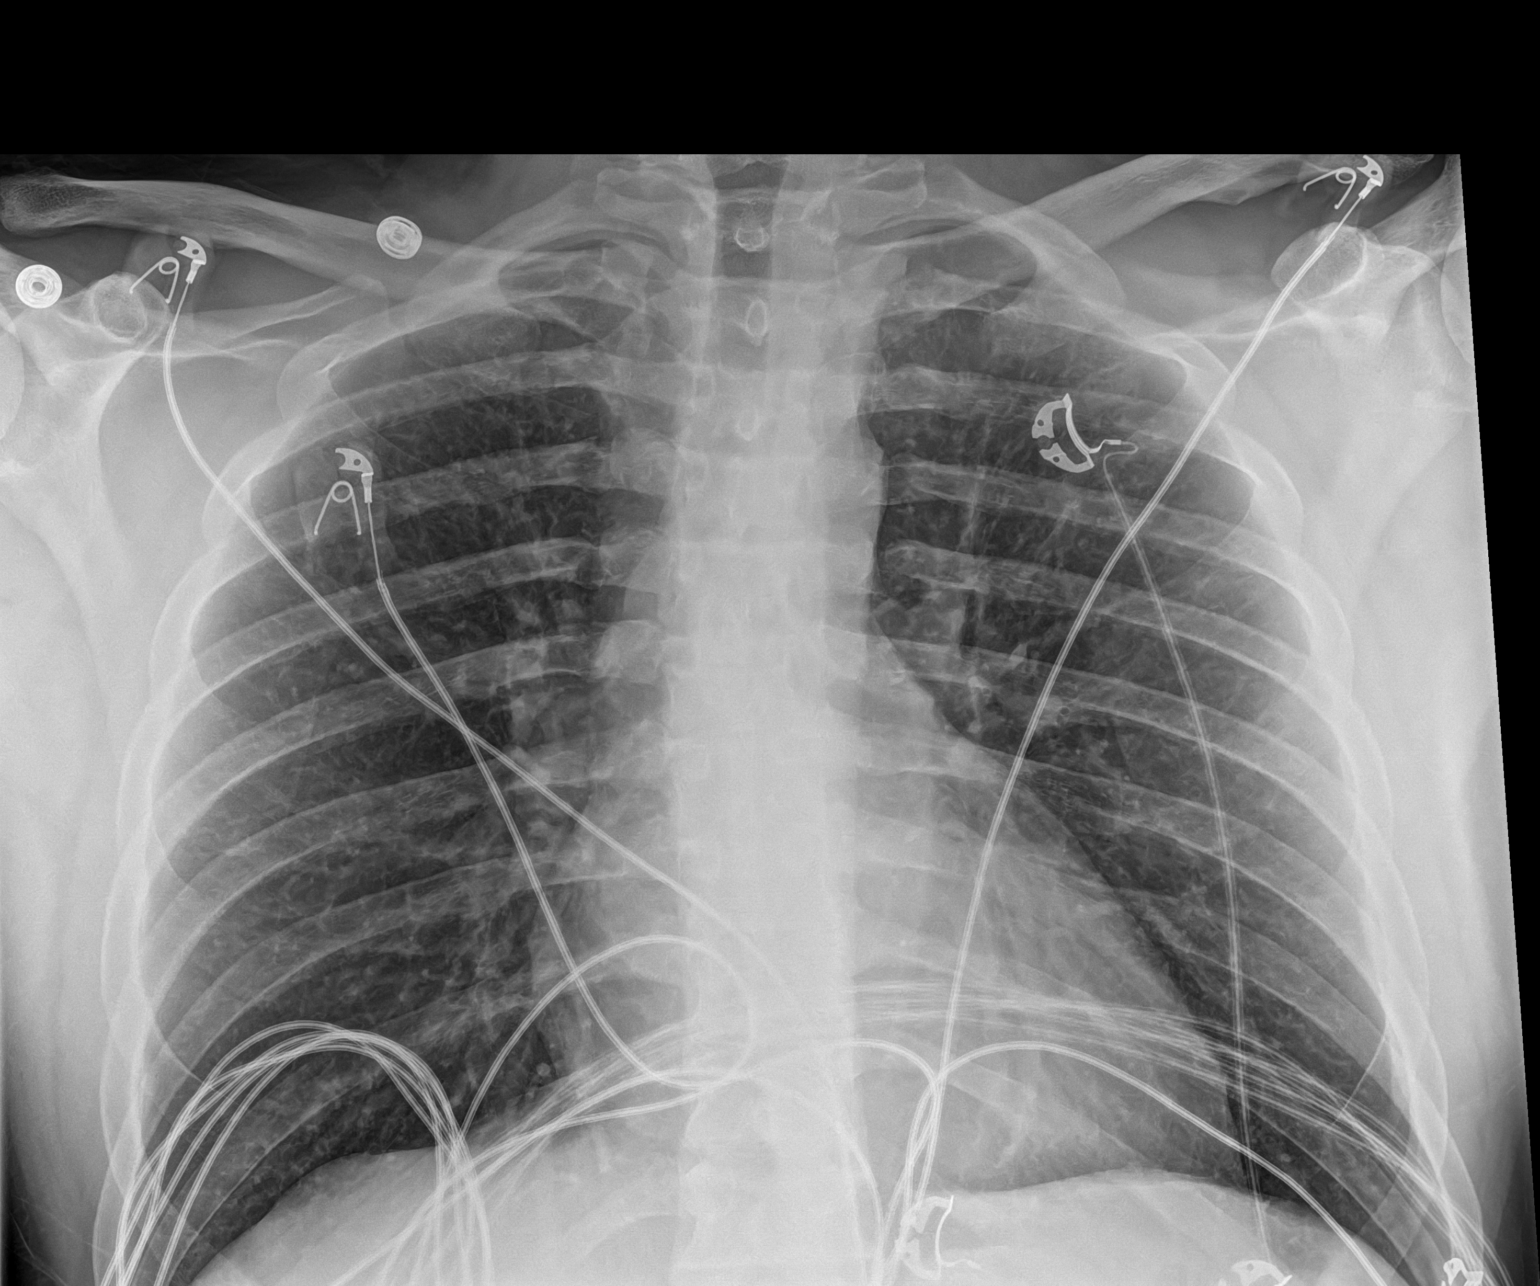
[im 2/2]
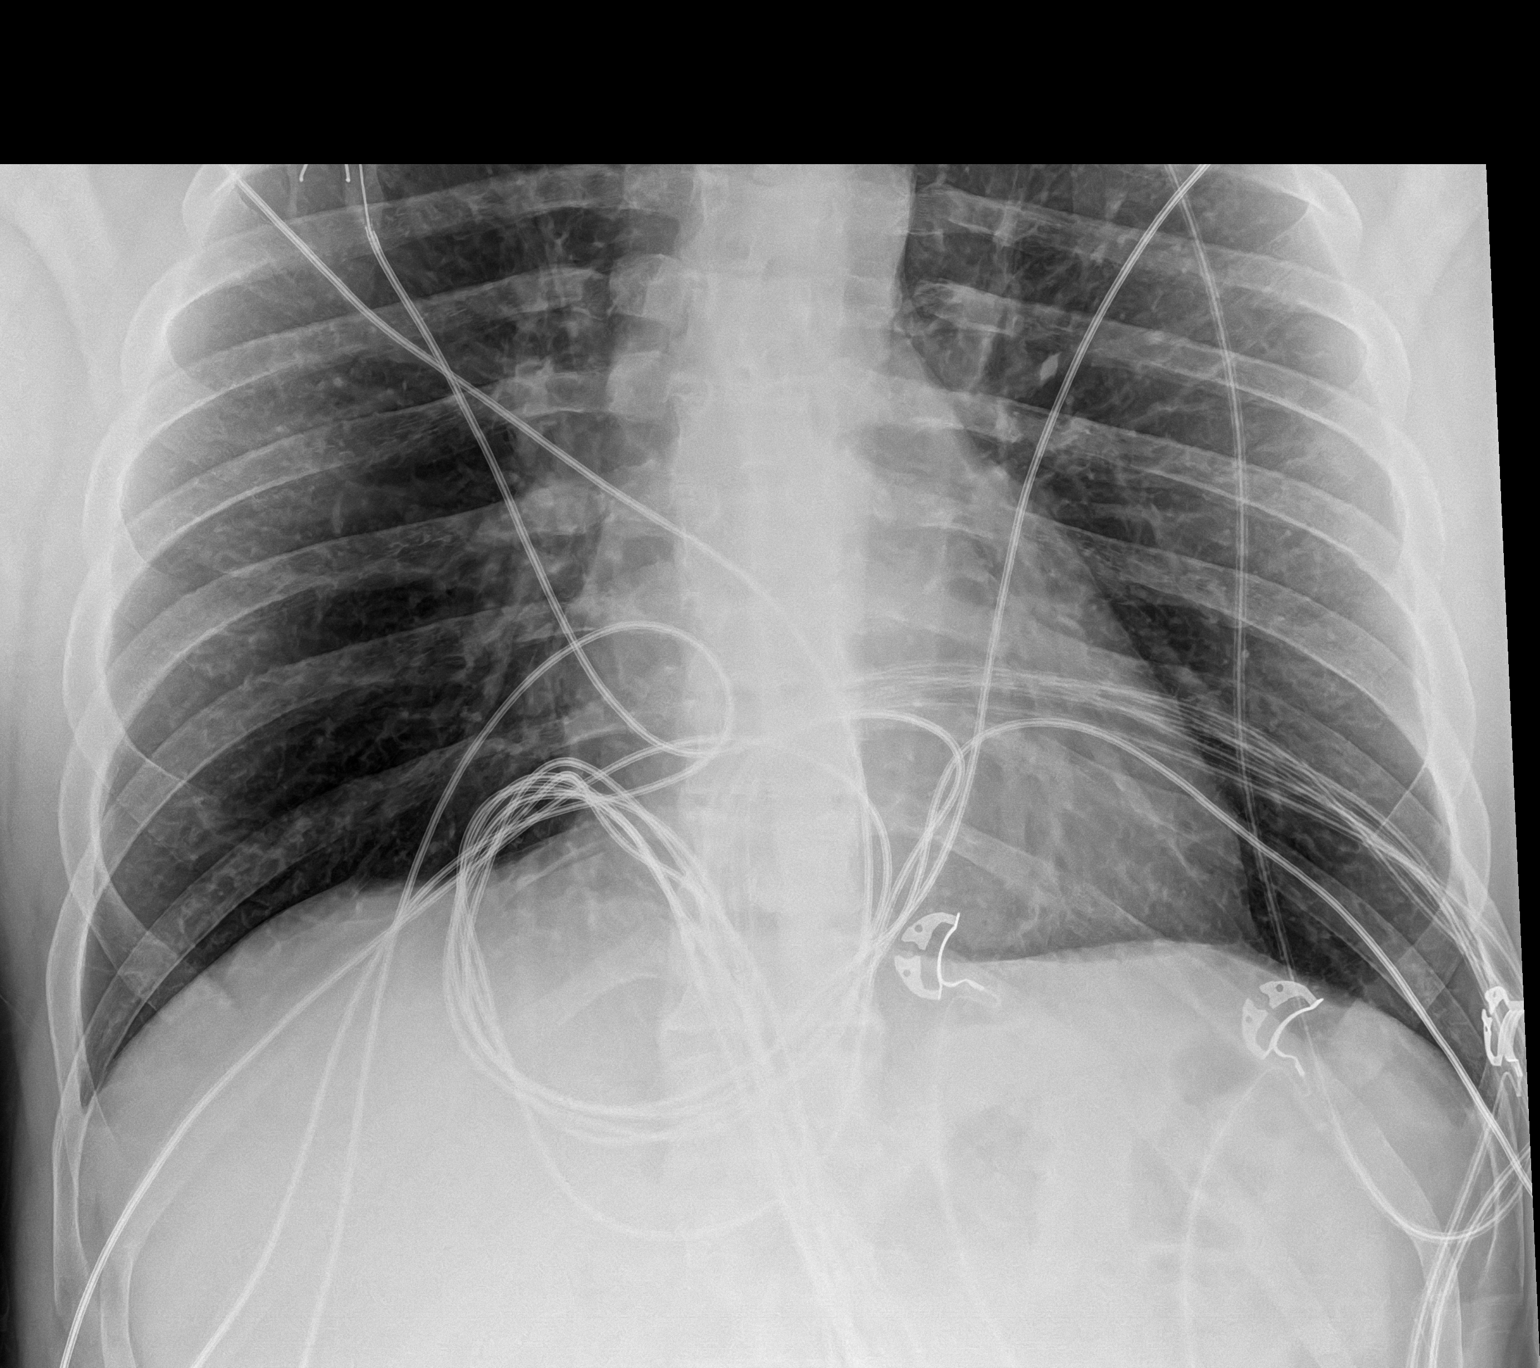

[2 of 2 positions shown; findings below may reference images not displayed]

FINDINGS: Artifact overlies the chest. Allowing for that, the heart and
mediastinal shadows are normal. The lungs are clear. The vascularity
is normal. No effusions. No significant bone finding.
IMPRESSION: No active disease.  Overlying artifact.

## 2022-08-11 ENCOUNTER — Other Ambulatory Visit: Payer: Self-pay

## 2022-08-11 ENCOUNTER — Emergency Department (HOSPITAL_BASED_OUTPATIENT_CLINIC_OR_DEPARTMENT_OTHER)
Admission: EM | Admit: 2022-08-11 | Discharge: 2022-08-11 | Disposition: A | Discharge status: Home / Self Care | Payer: Self-pay | Attending: Emergency Medicine | Admitting: Emergency Medicine

## 2022-08-11 ENCOUNTER — Encounter (HOSPITAL_BASED_OUTPATIENT_CLINIC_OR_DEPARTMENT_OTHER): Payer: Self-pay

## 2022-08-11 ENCOUNTER — Emergency Department (HOSPITAL_BASED_OUTPATIENT_CLINIC_OR_DEPARTMENT_OTHER): Payer: Self-pay | Admitting: Radiology

## 2022-08-11 DIAGNOSIS — S2241XA Multiple fractures of ribs, right side, initial encounter for closed fracture: Secondary | ICD-10-CM | POA: Insufficient documentation

## 2022-08-11 DIAGNOSIS — W1839XA Other fall on same level, initial encounter: Secondary | ICD-10-CM | POA: Insufficient documentation

## 2022-08-11 NOTE — ED Provider Notes (Signed)
[facility]Y DEPT Provider Note   CSN: [id number] Arrival date & time: 08/11/22  1251     History {Add pertinent medical, surgical, social history, OB history to HPI:1} Chief Complaint  Patient presents with   Mathew Palmer is a 36 y.o. male.  He is here with complaint of some right-sided rib pain after a fall a few days ago over a lawn chair.  Pain is worse with bending twisting coughing sneezing.  No fever no hemoptysis.  No other injuries or complaints.  The history is provided by the patient.  Chest Pain Pain location:  R lateral chest Pain quality: stabbing   Pain severity:  Moderate Onset quality:  Sudden Timing:  Intermittent Progression:  Unchanged Chronicity:  New Context: trauma   Relieved by:  Nothing Worsened by:  Coughing and movement Ineffective treatments:  Rest Associated symptoms: no abdominal pain, no fever, no nausea, no numbness, no shortness of breath, no vomiting and no weakness   Risk factors: smoking        Home Medications Prior to Admission medications   Medication Sig Start Date End Date Taking? Authorizing Provider  cephALEXin (KEFLEX) 500 MG capsule Take 1 capsule (500 mg total) by mouth 4 (four) times daily. 09/04/16   Lavera Guise, MD  ondansetron (ZOFRAN ODT) 4 MG disintegrating tablet Take 1 tablet (4 mg total) by mouth every 8 (eight) hours as needed for nausea or vomiting. 09/12/17   Long, Arlyss Repress, MD      Allergies    Patient has no known allergies.    Review of Systems   Review of Systems  Constitutional:  Negative for fever.  Respiratory:  Negative for shortness of breath.   Cardiovascular:  Positive for chest pain.  Gastrointestinal:  Negative for abdominal pain, nausea and vomiting.  Neurological:  Negative for weakness and numbness.    Physical Exam Updated Vital Signs BP (!) 126/93 (BP Location: Left Arm)   Pulse (!) 58   Temp (!) 97.3 F (36.3 C)   Resp 18   Ht 6\' 3"  (1.905 m)   Wt  104.3 kg   SpO2 99%   BMI 28.74 kg/m  Physical Exam Vitals and nursing note reviewed.  Constitutional:      General: He is not in acute distress.    Appearance: Normal appearance. He is well-developed.  HENT:     Head: Normocephalic and atraumatic.  Eyes:     Conjunctiva/sclera: Conjunctivae normal.  Cardiovascular:     Rate and Rhythm: Normal rate and regular rhythm.     Heart sounds: No murmur heard. Pulmonary:     Effort: Pulmonary effort is normal. No respiratory distress.     Breath sounds: Normal breath sounds.  Chest:    Abdominal:     Palpations: Abdomen is soft.     Tenderness: There is no abdominal tenderness.  Musculoskeletal:     Cervical back: Neck supple.  Skin:    General: Skin is warm and dry.     Capillary Refill: Capillary refill takes less than 2 seconds.  Neurological:     Mental Status: He is alert.     ED Results / Procedures / Treatments   Labs (all labs ordered are listed, but only abnormal results are displayed) Labs Reviewed - No data to display  EKG None  Radiology DG Ribs Unilateral W/Chest Right  Result Date: 08/11/2022 CLINICAL DATA:  Trauma, fall EXAM: RIGHT RIBS AND CHEST - 3+ VIEW COMPARISON:  02/03/2021 FINDINGS: Cardiac size is within normal limits. Lung fields are clear of any infiltrates or pulmonary edema. There is no pleural effusion or pneumothorax. There is minimal cortical irregularity in the lateral aspect of right sixth and seventh ribs. IMPRESSION: Undisplaced fractures are seen in the lateral aspect of right sixth and seventh ribs. There is no focal pulmonary contusion. There is no pleural effusion or pneumothorax. Electronically Signed   By: Ernie Avena M.D.   On: 08/11/2022 14:34    Procedures Procedures  {Document cardiac monitor, telemetry assessment procedure when appropriate:1}  Medications Ordered in ED Medications - No data to display  ED Course/ Medical Decision Making/ A&P                            Medical Decision Making  ***  {Document critical care time when appropriate:1} {Document review of labs and clinical decision tools ie heart score, Chads2Vasc2 etc:1}  {Document your independent review of radiology images, and any outside records:1} {Document your discussion with family members, caretakers, and with consultants:1} {Document social determinants of health affecting pt's care:1} {Document your decision making why or why not admission, treatments were needed:1} Final Clinical Impression(s) / ED Diagnoses Final diagnoses:  None    Rx / DC Orders ED Discharge Orders     None
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
## 2022-08-11 NOTE — Discharge Instructions (Signed)
You were seen in the emergency department for continued right-sided chest pain after a fall.  Your x-ray showed 2 rib fractures on the right.  There is no evidence of a pneumothorax or dropped lung.  You can use ice or heat to the affected area as tolerated, Tylenol and ibuprofen for pain.  Return to the emergency department if any worsening or concerning symptoms.

## 2023-03-05 ENCOUNTER — Other Ambulatory Visit: Payer: Self-pay

## 2023-03-05 ENCOUNTER — Emergency Department (HOSPITAL_COMMUNITY): Payer: Self-pay

## 2023-03-05 ENCOUNTER — Encounter (HOSPITAL_COMMUNITY): Payer: Self-pay

## 2023-03-05 ENCOUNTER — Emergency Department (HOSPITAL_COMMUNITY)
Admission: EM | Admit: 2023-03-05 | Discharge: 2023-03-05 | Disposition: A | Discharge status: Home / Self Care | Payer: Self-pay | Attending: Emergency Medicine | Admitting: Emergency Medicine

## 2023-03-05 DIAGNOSIS — Y92410 Unspecified street and highway as the place of occurrence of the external cause: Secondary | ICD-10-CM | POA: Diagnosis not present

## 2023-03-05 DIAGNOSIS — S2231XA Fracture of one rib, right side, initial encounter for closed fracture: Secondary | ICD-10-CM | POA: Diagnosis not present

## 2023-03-05 DIAGNOSIS — S0003XA Contusion of scalp, initial encounter: Secondary | ICD-10-CM | POA: Diagnosis not present

## 2023-03-05 DIAGNOSIS — Y908 Blood alcohol level of 240 mg/100 ml or more: Secondary | ICD-10-CM | POA: Diagnosis not present

## 2023-03-05 DIAGNOSIS — F10129 Alcohol abuse with intoxication, unspecified: Secondary | ICD-10-CM | POA: Insufficient documentation

## 2023-03-05 DIAGNOSIS — S0033XA Contusion of nose, initial encounter: Secondary | ICD-10-CM | POA: Insufficient documentation

## 2023-03-05 LAB — URINALYSIS, ROUTINE W REFLEX MICROSCOPIC
Bacteria, UA: NONE SEEN
Bilirubin Urine: NEGATIVE
Glucose, UA: NEGATIVE mg/dL
Ketones, ur: NEGATIVE mg/dL
Leukocytes,Ua: NEGATIVE
Nitrite: NEGATIVE
Protein, ur: NEGATIVE mg/dL
Specific Gravity, Urine: 1.003 — ABNORMAL LOW (ref 1.005–1.030)
pH: 7 (ref 5.0–8.0)

## 2023-03-05 LAB — COMPREHENSIVE METABOLIC PANEL
ALT: 83 U/L — ABNORMAL HIGH (ref 0–44)
AST: 74 U/L — ABNORMAL HIGH (ref 15–41)
Albumin: 3.9 g/dL (ref 3.5–5.0)
Alkaline Phosphatase: 78 U/L (ref 38–126)
Anion gap: 12 (ref 5–15)
BUN: 5 mg/dL — ABNORMAL LOW (ref 6–20)
CO2: 22 mmol/L (ref 22–32)
Calcium: 8.6 mg/dL — ABNORMAL LOW (ref 8.9–10.3)
Chloride: 102 mmol/L (ref 98–111)
Creatinine, Ser: 1 mg/dL (ref 0.61–1.24)
GFR, Estimated: >60 mL/min (ref >60)
Glucose, Bld: 91 mg/dL (ref 70–99)
Potassium: 3.8 mmol/L (ref 3.5–5.1)
Sodium: 136 mmol/L (ref 135–145)
Total Bilirubin: 0.8 mg/dL (ref 0.3–1.2)
Total Protein: 6.9 g/dL (ref 6.5–8.1)

## 2023-03-05 LAB — ETHANOL: Alcohol, Ethyl (B): 215 mg/dL — ABNORMAL HIGH (ref <10)

## 2023-03-05 LAB — PROTIME-INR
INR: 0.9 (ref 0.8–1.2)
Prothrombin Time: 12.1 seconds (ref 11.4–15.2)

## 2023-03-05 LAB — CBC
HCT: 48.9 % (ref 39.0–52.0)
Hemoglobin: 17.2 g/dL — ABNORMAL HIGH (ref 13.0–17.0)
MCH: 35.9 pg — ABNORMAL HIGH (ref 26.0–34.0)
MCHC: 35.2 g/dL (ref 30.0–36.0)
MCV: 102.1 fL — ABNORMAL HIGH (ref 80.0–100.0)
Platelets: 178 10*3/uL (ref 150–400)
RBC: 4.79 MIL/uL (ref 4.22–5.81)
RDW: 14 % (ref 11.5–15.5)
WBC: 9.6 10*3/uL (ref 4.0–10.5)
nRBC: 0 % (ref 0.0–0.2)

## 2023-03-05 LAB — LACTIC ACID, PLASMA: Lactic Acid, Venous: 3 mmol/L (ref 0.5–1.9)

## 2023-03-05 LAB — SAMPLE TO BLOOD BANK

## 2023-03-05 MED ORDER — SODIUM CHLORIDE 0.9 % IV BOLUS
1000.0000 mL | Freq: Once | INTRAVENOUS | Status: AC
Start: 2023-03-05 — End: 2023-03-05
  Administered 2023-03-05: 1000 mL via INTRAVENOUS

## 2023-03-05 MED ORDER — IOHEXOL 350 MG/ML SOLN
75.0000 mL | Freq: Once | INTRAVENOUS | Status: AC | PRN
  Administered 2023-03-05: 75 mL via INTRAVENOUS

## 2023-03-05 MED ORDER — SODIUM CHLORIDE 0.9 % IV BOLUS
1000.0000 mL | Freq: Once | INTRAVENOUS | Status: DC
Start: 2023-03-05 — End: 2023-03-05

## 2023-03-05 MED ORDER — OXYCODONE HCL 5 MG PO TABS: 2.5000 mg | ORAL_TABLET | ORAL | 0 refills | Status: DC | PRN
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
## 2023-03-05 NOTE — ED Notes (Signed)
Patient verbalizes understanding of discharge instructions. Opportunity for questioning and answers were provided. Pt discharged from ED. 

## 2023-03-05 NOTE — Discharge Instructions (Signed)

## 2024-04-19 DIAGNOSIS — Z419 Encounter for procedure for purposes other than remedying health state, unspecified: Secondary | ICD-10-CM | POA: Diagnosis not present

## 2024-05-20 DIAGNOSIS — Z419 Encounter for procedure for purposes other than remedying health state, unspecified: Secondary | ICD-10-CM | POA: Diagnosis not present

## 2024-06-03 DIAGNOSIS — R030 Elevated blood-pressure reading, without diagnosis of hypertension: Secondary | ICD-10-CM | POA: Diagnosis not present

## 2024-06-03 DIAGNOSIS — Z6830 Body mass index (BMI) 30.0-30.9, adult: Secondary | ICD-10-CM | POA: Diagnosis not present

## 2024-06-03 DIAGNOSIS — L0390 Cellulitis, unspecified: Secondary | ICD-10-CM | POA: Diagnosis not present

## 2024-06-03 DIAGNOSIS — E669 Obesity, unspecified: Secondary | ICD-10-CM | POA: Diagnosis not present

## 2024-06-19 DIAGNOSIS — Z419 Encounter for procedure for purposes other than remedying health state, unspecified: Secondary | ICD-10-CM | POA: Diagnosis not present

## 2024-07-20 DIAGNOSIS — Z419 Encounter for procedure for purposes other than remedying health state, unspecified: Secondary | ICD-10-CM | POA: Diagnosis not present

## 2024-08-02 DIAGNOSIS — K122 Cellulitis and abscess of mouth: Secondary | ICD-10-CM | POA: Diagnosis not present

## 2024-08-20 DIAGNOSIS — Z419 Encounter for procedure for purposes other than remedying health state, unspecified: Secondary | ICD-10-CM | POA: Diagnosis not present

## 2024-08-22 ENCOUNTER — Emergency Department (HOSPITAL_COMMUNITY)

## 2024-08-22 ENCOUNTER — Encounter (HOSPITAL_COMMUNITY)
Admission: EM | Disposition: A | Discharge status: Home / Self Care | Payer: Self-pay | Source: Home / Self Care | Attending: Internal Medicine

## 2024-08-22 ENCOUNTER — Inpatient Hospital Stay (HOSPITAL_COMMUNITY)
Admission: EM | Admit: 2024-08-22 | Discharge: 2024-08-23 | DRG: 322 | Disposition: A | Discharge status: Home / Self Care | Attending: Internal Medicine | Admitting: Internal Medicine

## 2024-08-22 ENCOUNTER — Encounter (HOSPITAL_COMMUNITY): Payer: Self-pay | Admitting: Emergency Medicine

## 2024-08-22 ENCOUNTER — Other Ambulatory Visit: Payer: Self-pay

## 2024-08-22 DIAGNOSIS — Z7982 Long term (current) use of aspirin: Secondary | ICD-10-CM | POA: Diagnosis not present

## 2024-08-22 DIAGNOSIS — I2102 ST elevation (STEMI) myocardial infarction involving left anterior descending coronary artery: Secondary | ICD-10-CM

## 2024-08-22 DIAGNOSIS — F1010 Alcohol abuse, uncomplicated: Secondary | ICD-10-CM | POA: Diagnosis not present

## 2024-08-22 DIAGNOSIS — Z833 Family history of diabetes mellitus: Secondary | ICD-10-CM | POA: Diagnosis not present

## 2024-08-22 DIAGNOSIS — I214 Non-ST elevation (NSTEMI) myocardial infarction: Principal | ICD-10-CM | POA: Diagnosis present

## 2024-08-22 DIAGNOSIS — E871 Hypo-osmolality and hyponatremia: Secondary | ICD-10-CM | POA: Diagnosis present

## 2024-08-22 DIAGNOSIS — Z8249 Family history of ischemic heart disease and other diseases of the circulatory system: Secondary | ICD-10-CM

## 2024-08-22 DIAGNOSIS — I252 Old myocardial infarction: Secondary | ICD-10-CM | POA: Diagnosis not present

## 2024-08-22 DIAGNOSIS — I2510 Atherosclerotic heart disease of native coronary artery without angina pectoris: Secondary | ICD-10-CM

## 2024-08-22 DIAGNOSIS — F1210 Cannabis abuse, uncomplicated: Secondary | ICD-10-CM | POA: Diagnosis present

## 2024-08-22 DIAGNOSIS — Z1152 Encounter for screening for COVID-19: Secondary | ICD-10-CM | POA: Diagnosis not present

## 2024-08-22 DIAGNOSIS — Z79899 Other long term (current) drug therapy: Secondary | ICD-10-CM

## 2024-08-22 DIAGNOSIS — F17210 Nicotine dependence, cigarettes, uncomplicated: Secondary | ICD-10-CM | POA: Diagnosis present

## 2024-08-22 DIAGNOSIS — Z955 Presence of coronary angioplasty implant and graft: Secondary | ICD-10-CM

## 2024-08-22 DIAGNOSIS — I255 Ischemic cardiomyopathy: Secondary | ICD-10-CM | POA: Diagnosis present

## 2024-08-22 DIAGNOSIS — R079 Chest pain, unspecified: Secondary | ICD-10-CM

## 2024-08-22 DIAGNOSIS — Z7902 Long term (current) use of antithrombotics/antiplatelets: Secondary | ICD-10-CM

## 2024-08-22 DIAGNOSIS — E785 Hyperlipidemia, unspecified: Secondary | ICD-10-CM | POA: Diagnosis not present

## 2024-08-22 HISTORY — PX: CORONARY/GRAFT ACUTE MI REVASCULARIZATION: CATH118305

## 2024-08-22 HISTORY — PX: CORONARY STENT INTERVENTION: CATH118234

## 2024-08-22 HISTORY — PX: LEFT HEART CATH AND CORONARY ANGIOGRAPHY: CATH118249

## 2024-08-22 LAB — RESP PANEL BY RT-PCR (RSV, FLU A&B, COVID)  RVPGX2
Influenza A by PCR: NEGATIVE
Influenza B by PCR: NEGATIVE
Resp Syncytial Virus by PCR: NEGATIVE
SARS Coronavirus 2 by RT PCR: NEGATIVE

## 2024-08-22 LAB — CBC WITH DIFFERENTIAL/PLATELET
Abs Immature Granulocytes: 0.02 K/uL (ref 0.00–0.07)
Basophils Absolute: 0.1 K/uL (ref 0.0–0.1)
Basophils Relative: 1 %
Eosinophils Absolute: 0.1 K/uL (ref 0.0–0.5)
Eosinophils Relative: 1 %
HCT: 45.2 % (ref 39.0–52.0)
Hemoglobin: 15.8 g/dL (ref 13.0–17.0)
Immature Granulocytes: 0 %
Lymphocytes Relative: 24 %
Lymphs Abs: 2.4 K/uL (ref 0.7–4.0)
MCH: 35 pg — ABNORMAL HIGH (ref 26.0–34.0)
MCHC: 35 g/dL (ref 30.0–36.0)
MCV: 100.2 fL — ABNORMAL HIGH (ref 80.0–100.0)
Monocytes Absolute: 0.7 K/uL (ref 0.1–1.0)
Monocytes Relative: 7 %
Neutro Abs: 6.5 K/uL (ref 1.7–7.7)
Neutrophils Relative %: 67 %
Platelets: 218 K/uL (ref 150–400)
RBC: 4.51 MIL/uL (ref 4.22–5.81)
RDW: 13.1 % (ref 11.5–15.5)
WBC: 9.8 K/uL (ref 4.0–10.5)
nRBC: 0 % (ref 0.0–0.2)

## 2024-08-22 LAB — COMPREHENSIVE METABOLIC PANEL WITH GFR
ALT: 41 U/L (ref 0–44)
AST: 54 U/L — ABNORMAL HIGH (ref 15–41)
Albumin: 3.7 g/dL (ref 3.5–5.0)
Alkaline Phosphatase: 79 U/L (ref 38–126)
Anion gap: 14 (ref 5–15)
BUN: 8 mg/dL (ref 6–20)
CO2: 22 mmol/L (ref 22–32)
Calcium: 8.7 mg/dL — ABNORMAL LOW (ref 8.9–10.3)
Chloride: 97 mmol/L — ABNORMAL LOW (ref 98–111)
Creatinine, Ser: 0.91 mg/dL (ref 0.61–1.24)
GFR, Estimated: >60 mL/min (ref >60)
Glucose, Bld: 109 mg/dL — ABNORMAL HIGH (ref 70–99)
Potassium: 3.8 mmol/L (ref 3.5–5.1)
Sodium: 133 mmol/L — ABNORMAL LOW (ref 135–145)
Total Bilirubin: 0.7 mg/dL (ref 0.0–1.2)
Total Protein: 7 g/dL (ref 6.5–8.1)

## 2024-08-22 LAB — CBC
HCT: 48.3 % (ref 39.0–52.0)
Hemoglobin: 16.6 g/dL (ref 13.0–17.0)
MCH: 34.7 pg — ABNORMAL HIGH (ref 26.0–34.0)
MCHC: 34.4 g/dL (ref 30.0–36.0)
MCV: 101 fL — ABNORMAL HIGH (ref 80.0–100.0)
Platelets: 225 K/uL (ref 150–400)
RBC: 4.78 MIL/uL (ref 4.22–5.81)
RDW: 13.1 % (ref 11.5–15.5)
WBC: 9.1 K/uL (ref 4.0–10.5)
nRBC: 0 % (ref 0.0–0.2)

## 2024-08-22 LAB — BASIC METABOLIC PANEL WITH GFR
Anion gap: 12 (ref 5–15)
BUN: 6 mg/dL (ref 6–20)
CO2: 22 mmol/L (ref 22–32)
Calcium: 9.2 mg/dL (ref 8.9–10.3)
Chloride: 102 mmol/L (ref 98–111)
Creatinine, Ser: 0.98 mg/dL (ref 0.61–1.24)
GFR, Estimated: >60 mL/min (ref >60)
Glucose, Bld: 104 mg/dL — ABNORMAL HIGH (ref 70–99)
Potassium: 4.1 mmol/L (ref 3.5–5.1)
Sodium: 136 mmol/L (ref 135–145)

## 2024-08-22 LAB — POCT ACTIVATED CLOTTING TIME
Activated Clotting Time: 176 s
Activated Clotting Time: 331 s

## 2024-08-22 LAB — SEDIMENTATION RATE: Sed Rate: 1 mm/h (ref 0–15)

## 2024-08-22 LAB — TROPONIN I (HIGH SENSITIVITY)
Troponin I (High Sensitivity): 2728 ng/L (ref <18)
Troponin I (High Sensitivity): 9659 ng/L (ref <18)
Troponin I (High Sensitivity): 21047 ng/L
Troponin I (High Sensitivity): 22275 ng/L (ref <18)
Troponin I (High Sensitivity): 18505 ng/L (ref <18)

## 2024-08-22 LAB — LIPID PANEL
Cholesterol: 270 mg/dL — ABNORMAL HIGH (ref 0–200)
HDL: 46 mg/dL (ref >40)
LDL Cholesterol: 171 mg/dL — ABNORMAL HIGH (ref 0–99)
Total CHOL/HDL Ratio: 5.9 ratio
Triglycerides: 267 mg/dL — ABNORMAL HIGH (ref <150)
VLDL: 53 mg/dL — ABNORMAL HIGH (ref 0–40)

## 2024-08-22 LAB — RAPID URINE DRUG SCREEN, HOSP PERFORMED
Amphetamines: NOT DETECTED
Barbiturates: NOT DETECTED
Benzodiazepines: NOT DETECTED
Cocaine: NOT DETECTED
Opiates: NOT DETECTED
Tetrahydrocannabinol: POSITIVE — AB

## 2024-08-22 LAB — MAGNESIUM: Magnesium: 2 mg/dL (ref 1.7–2.4)

## 2024-08-22 LAB — HIV ANTIBODY (ROUTINE TESTING W REFLEX): HIV Screen 4th Generation wRfx: NONREACTIVE

## 2024-08-22 LAB — C-REACTIVE PROTEIN: CRP: 0.5 mg/dL (ref <1.0)

## 2024-08-22 LAB — LIPASE, BLOOD: Lipase: 32 U/L (ref 11–51)

## 2024-08-22 LAB — HEPARIN LEVEL (UNFRACTIONATED): Heparin Unfractionated: 0.1 [IU]/mL — ABNORMAL LOW (ref 0.30–0.70)

## 2024-08-22 LAB — ETHANOL: Alcohol, Ethyl (B): <15 mg/dL (ref <15)

## 2024-08-22 SURGERY — CORONARY/GRAFT ACUTE MI REVASCULARIZATION
Anesthesia: LOCAL

## 2024-08-22 MED ORDER — LABETALOL HCL 5 MG/ML IV SOLN: 10.0000 mg | INTRAVENOUS | Status: AC | PRN

## 2024-08-22 MED ORDER — NICOTINE 21 MG/24HR TD PT24
21.0000 mg | MEDICATED_PATCH | Freq: Every day | TRANSDERMAL | Status: DC
Start: 2024-08-22 — End: 2024-08-23
  Administered 2024-08-22 – 2024-08-23 (×2): 21 mg via TRANSDERMAL
  Filled 2024-08-22 (×2): qty 1

## 2024-08-22 MED ORDER — LIDOCAINE VISCOUS HCL 2 % MT SOLN
15.0000 mL | Freq: Once | OROMUCOSAL | Status: AC
  Administered 2024-08-22: 15 mL via ORAL
  Filled 2024-08-22: qty 15

## 2024-08-22 MED ORDER — TIROFIBAN (AGGRASTAT) BOLUS VIA INFUSION
INTRAVENOUS | Status: DC | PRN
  Administered 2024-08-22: 2710 ug via INTRAVENOUS

## 2024-08-22 MED ORDER — MIDAZOLAM HCL 2 MG/2ML IJ SOLN
INTRAMUSCULAR | Status: AC
  Filled 2024-08-22: qty 2

## 2024-08-22 MED ORDER — NITROGLYCERIN 1 MG/10 ML FOR IR/CATH LAB
INTRA_ARTERIAL | Status: AC
  Filled 2024-08-22: qty 10

## 2024-08-22 MED ORDER — ONDANSETRON HCL 4 MG PO TABS: 4.0000 mg | ORAL_TABLET | Freq: Four times a day (QID) | ORAL | Status: DC | PRN

## 2024-08-22 MED ORDER — MORPHINE SULFATE (PF) 2 MG/ML IV SOLN: 1.0000 mg | INTRAVENOUS | Status: DC | PRN

## 2024-08-22 MED ORDER — LORAZEPAM 1 MG PO TABS: 1.0000 mg | ORAL_TABLET | ORAL | Status: DC | PRN

## 2024-08-22 MED ORDER — SODIUM CHLORIDE 0.9 % WEIGHT BASED INFUSION
1.0000 mL/kg/h | INTRAVENOUS | Status: DC
  Administered 2024-08-22: 1 mL/kg/h via INTRAVENOUS

## 2024-08-22 MED ORDER — LIDOCAINE HCL (PF) 1 % IJ SOLN
INTRAMUSCULAR | Status: AC
  Filled 2024-08-22: qty 30

## 2024-08-22 MED ORDER — HEPARIN BOLUS VIA INFUSION
4000.0000 [IU] | Freq: Once | INTRAVENOUS | Status: AC
  Administered 2024-08-22: 4000 [IU] via INTRAVENOUS

## 2024-08-22 MED ORDER — ATORVASTATIN CALCIUM 40 MG PO TABS
40.0000 mg | ORAL_TABLET | Freq: Every day | ORAL | Status: DC
  Administered 2024-08-22: 40 mg via ORAL
  Filled 2024-08-22: qty 1

## 2024-08-22 MED ORDER — PANTOPRAZOLE SODIUM 40 MG PO TBEC: 40.0000 mg | DELAYED_RELEASE_TABLET | Freq: Every day | ORAL | Status: DC

## 2024-08-22 MED ORDER — HEPARIN (PORCINE) 25000 UT/250ML-% IV SOLN
1400.0000 [IU]/h | INTRAVENOUS | Status: DC
  Administered 2024-08-22: 1400 [IU]/h via INTRAVENOUS
  Filled 2024-08-22: qty 250

## 2024-08-22 MED ORDER — LIDOCAINE HCL (PF) 1 % IJ SOLN
INTRAMUSCULAR | Status: DC | PRN
  Administered 2024-08-22: 2 mL

## 2024-08-22 MED ORDER — HEPARIN SODIUM (PORCINE) 1000 UNIT/ML IJ SOLN
INTRAMUSCULAR | Status: DC | PRN
  Administered 2024-08-22 (×2): 5000 [IU] via INTRAVENOUS

## 2024-08-22 MED ORDER — FENTANYL CITRATE (PF) 100 MCG/2ML IJ SOLN
INTRAMUSCULAR | Status: AC
  Filled 2024-08-22: qty 2

## 2024-08-22 MED ORDER — FENTANYL CITRATE (PF) 100 MCG/2ML IJ SOLN
INTRAMUSCULAR | Status: DC | PRN
  Administered 2024-08-22: 25 ug via INTRAVENOUS

## 2024-08-22 MED ORDER — ACETAMINOPHEN 325 MG PO TABS: 650.0000 mg | ORAL_TABLET | Freq: Four times a day (QID) | ORAL | Status: DC | PRN

## 2024-08-22 MED ORDER — OXYCODONE HCL 5 MG PO TABS: 5.0000 mg | ORAL_TABLET | ORAL | Status: DC | PRN

## 2024-08-22 MED ORDER — IOHEXOL 350 MG/ML SOLN
INTRAVENOUS | Status: DC | PRN
  Administered 2024-08-22: 135 mL via INTRA_ARTERIAL

## 2024-08-22 MED ORDER — PANTOPRAZOLE SODIUM 40 MG PO TBEC
40.0000 mg | DELAYED_RELEASE_TABLET | Freq: Two times a day (BID) | ORAL | Status: DC
  Administered 2024-08-22 – 2024-08-23 (×3): 40 mg via ORAL
  Filled 2024-08-22 (×3): qty 1

## 2024-08-22 MED ORDER — ONDANSETRON HCL 4 MG/2ML IJ SOLN: 4.0000 mg | Freq: Four times a day (QID) | INTRAMUSCULAR | Status: DC | PRN

## 2024-08-22 MED ORDER — ATORVASTATIN CALCIUM 80 MG PO TABS
80.0000 mg | ORAL_TABLET | Freq: Every day | ORAL | Status: DC
  Administered 2024-08-23: 80 mg via ORAL
  Filled 2024-08-22: qty 1

## 2024-08-22 MED ORDER — ALUM & MAG HYDROXIDE-SIMETH 200-200-20 MG/5ML PO SUSP
30.0000 mL | Freq: Once | ORAL | Status: AC
  Administered 2024-08-22: 30 mL via ORAL
  Filled 2024-08-22: qty 30

## 2024-08-22 MED ORDER — THIAMINE HCL 100 MG/ML IJ SOLN
100.0000 mg | Freq: Every day | INTRAMUSCULAR | Status: DC
  Administered 2024-08-22 – 2024-08-23 (×2): 100 mg via INTRAVENOUS
  Filled 2024-08-22 (×2): qty 2

## 2024-08-22 MED ORDER — FOLIC ACID 1 MG PO TABS
1.0000 mg | ORAL_TABLET | Freq: Every day | ORAL | Status: DC
  Administered 2024-08-22 – 2024-08-23 (×2): 1 mg via ORAL
  Filled 2024-08-22 (×2): qty 1

## 2024-08-22 MED ORDER — PRASUGREL HCL 10 MG PO TABS
10.0000 mg | ORAL_TABLET | Freq: Every day | ORAL | Status: DC
  Administered 2024-08-23: 10 mg via ORAL
  Filled 2024-08-22: qty 1

## 2024-08-22 MED ORDER — ASPIRIN 81 MG PO CHEW
324.0000 mg | CHEWABLE_TABLET | Freq: Once | ORAL | Status: AC
Start: 2024-08-22 — End: 2024-08-22
  Administered 2024-08-22: 324 mg via ORAL
  Filled 2024-08-22: qty 4

## 2024-08-22 MED ORDER — TIROFIBAN HCL IN NACL 5-0.9 MG/100ML-% IV SOLN
INTRAVENOUS | Status: DC | PRN
  Administered 2024-08-22: .15 ug/kg/min via INTRAVENOUS

## 2024-08-22 MED ORDER — HEPARIN SODIUM (PORCINE) 1000 UNIT/ML IJ SOLN
INTRAMUSCULAR | Status: AC
  Filled 2024-08-22: qty 10

## 2024-08-22 MED ORDER — SODIUM CHLORIDE 0.9 % WEIGHT BASED INFUSION
3.0000 mL/kg/h | INTRAVENOUS | Status: AC
  Administered 2024-08-22: 3 mL/kg/h via INTRAVENOUS

## 2024-08-22 MED ORDER — NITROGLYCERIN 1 MG/10 ML FOR IR/CATH LAB
INTRA_ARTERIAL | Status: DC | PRN
  Administered 2024-08-22: 200 ug via INTRACORONARY

## 2024-08-22 MED ORDER — PRASUGREL HCL 10 MG PO TABS
ORAL_TABLET | ORAL | Status: AC
  Filled 2024-08-22: qty 5

## 2024-08-22 MED ORDER — PRASUGREL HCL 10 MG PO TABS
ORAL_TABLET | ORAL | Status: DC | PRN
  Administered 2024-08-22: 60 mg via ORAL

## 2024-08-22 MED ORDER — VERAPAMIL HCL 2.5 MG/ML IV SOLN
INTRAVENOUS | Status: AC
  Filled 2024-08-22: qty 2

## 2024-08-22 MED ORDER — TICAGRELOR 90 MG PO TABS: ORAL_TABLET | ORAL | Status: DC | PRN

## 2024-08-22 MED ORDER — PRASUGREL HCL 10 MG PO TABS
ORAL_TABLET | ORAL | Status: AC
  Filled 2024-08-22: qty 1

## 2024-08-22 MED ORDER — NITROGLYCERIN 0.4 MG SL SUBL: 0.4000 mg | SUBLINGUAL_TABLET | SUBLINGUAL | Status: DC | PRN

## 2024-08-22 MED ORDER — MIDAZOLAM HCL 2 MG/2ML IJ SOLN
INTRAMUSCULAR | Status: DC | PRN
  Administered 2024-08-22: 1 mg via INTRAVENOUS

## 2024-08-22 MED ORDER — VERAPAMIL HCL 2.5 MG/ML IV SOLN
INTRAVENOUS | Status: DC | PRN
  Administered 2024-08-22: 10 mL via INTRA_ARTERIAL

## 2024-08-22 MED ORDER — SODIUM CHLORIDE 0.9 % IV SOLN: 250.0000 mL | INTRAVENOUS | Status: DC | PRN

## 2024-08-22 MED ORDER — ASPIRIN 81 MG PO TBEC
81.0000 mg | DELAYED_RELEASE_TABLET | Freq: Every day | ORAL | Status: DC
  Administered 2024-08-23: 81 mg via ORAL
  Filled 2024-08-22: qty 1

## 2024-08-22 MED ORDER — SODIUM CHLORIDE 0.9% FLUSH: 3.0000 mL | INTRAVENOUS | Status: DC | PRN

## 2024-08-22 MED ORDER — ACETAMINOPHEN 650 MG RE SUPP: 650.0000 mg | Freq: Four times a day (QID) | RECTAL | Status: DC | PRN

## 2024-08-22 MED ORDER — FENTANYL CITRATE (PF) 100 MCG/2ML IJ SOLN: 100.0000 ug | INTRAMUSCULAR | Status: DC | PRN

## 2024-08-22 MED ORDER — TIROFIBAN HCL IN NACL 5-0.9 MG/100ML-% IV SOLN
INTRAVENOUS | Status: AC
  Filled 2024-08-22: qty 100

## 2024-08-22 MED ORDER — SODIUM CHLORIDE 0.9% FLUSH
3.0000 mL | Freq: Two times a day (BID) | INTRAVENOUS | Status: DC
Start: 2024-08-22 — End: 2024-08-23
  Administered 2024-08-22 – 2024-08-23 (×2): 3 mL via INTRAVENOUS

## 2024-08-22 MED ORDER — HYDRALAZINE HCL 20 MG/ML IJ SOLN: 10.0000 mg | INTRAMUSCULAR | Status: AC | PRN

## 2024-08-22 SURGICAL SUPPLY — 14 items
BALLOON EMERGE MR 2.5X20 (BALLOONS) IMPLANT
BALLOON SAPPHIRE NC24 3.0X18 (BALLOONS) IMPLANT
CATH INFINITI AMBI 5FR TG (CATHETERS) IMPLANT
CATH VISTA GUIDE 6FR XBLD 3.5 (CATHETERS) IMPLANT
DEVICE RAD COMP TR BAND LRG (VASCULAR PRODUCTS) IMPLANT
GLIDESHEATH SLEND SS 6F .021 (SHEATH) IMPLANT
GUIDEWIRE INQWIRE 1.5J.035X260 (WIRE) IMPLANT
KIT ENCORE 26 ADVANTAGE (KITS) IMPLANT
KIT NAMIC PS PRESSURIZED FLUID (KITS) IMPLANT
PACK CARDIAC CATHETERIZATION (CUSTOM PROCEDURE TRAY) ×1 IMPLANT
SET ATX-X65L (MISCELLANEOUS) IMPLANT
STENT SYNERGY XD 2.50X24 (Permanent Stent) IMPLANT
WIRE ASAHI PROWATER 180CM (WIRE) IMPLANT
WIRE HI TORQ BMW 190CM (WIRE) IMPLANT

## 2024-08-22 NOTE — Assessment & Plan Note (Addendum)
 Atypical chest pain, but with very high sensitive troponin.  Currently his chest pain is improving but not chest pain free.   Plan to continue anticoagulation with IV heparin  for possible NSTEMI. For now add aspirin  and statin, until further cardiac work up, including cardiac catheterization.  Pain control with oral oxycodone  and IV morphine .  Continue blood pressure monitoring Follow up echocardiogram and further high sensitive troponin.  Check toxicology screen and alcohol level.  ED has contacted Cardiology in [facility] and recommended admission to [facility] with cardiology consultation.

## 2024-08-22 NOTE — H&P (View-Only) (Signed)
 Cardiology Consultation   Patient ID: IZEYAH DEIKE MRN: [id number]; DOB: 04-Oct-1986  Admit date: 08/22/2024 Date of Consult: 08/22/2024  PCP:  Patient, No Pcp Per   [facility] Providers Cardiologist:  None     Dr. Michele (new) Requesting Physician: Dr. Melvenia Reason: Chest Pain and NSTEMI   Patient Profile: Mathew Palmer is a 38 y.o. male with a history of polysubstance abuse (tobacco, alcohol, and marijuana) who is being seen 08/22/2024 for the evaluation of NSTEMI at the request of Dr Melvenia.  History of Present Illness: Mathew Palmer is a 38 year old male with the above history. No known prior cardiac history. He denies any history of hypertension, hyperlipidemia, or diabetes but does not regularly go to the doctor. He does not have a PCP and goes to an Urgent Care if he has an issues. He went to an Urgent Care a few weeks ago for a tooth infection and was treated with antibiotics.   He presented to the [facility] ED earlier this morning for further evaluation of chest pain. Upon arrival to the ED, EKG showed normal sinus rhythm, rate 72 bpm, with T wave inversions in lead aVL. High-sensitivity troponin elevated at 2,728 >> 9,659. Chest x-ray showed no acute findings. WBC 9.8, Hgb 15.8, Plts 218. Na 133, K 3.8, Glucose 109, BUN 8, Cr 0.91. Albumin 3.7, AST 54, ALT 41, Alk Phos 79, Total Bili 0.7. Lipase normal. Magnesium 2.0. Sed Rate normal. CRP pending. Urine drug screen positive for THC. ETOH level negative. He was started on IV Heparin  and admitted under the medicine service. He was transferred to [facility] and Cardiology consulted for further evaluation.   Patient was in his usual state of health until yesterday when he developed sudden onset of lower substernal chest soreness around 7:30pm after watching his son play at the park. He states this occurred after a mild coughing episode. The pain was got as bad as a 6-7/10 on the pain scale and his girlfriend convinced him to go  to the ED. Earlier yesterday afternoon (around 2-3pm), he also had an isolated episode of vomiting that occurred after he was drinking a lot and got warm and flushed.  Otherwise, no associated symptoms. No shortness of breath, orthopnea, PND, edema, palpitations, lightheadedness, dizziness, or syncope. No recent fevers or illnesses other than recent dental infection. No abnormal bleeding in urine or stools.  Patient has an active job and works in Therapist, music care. He denies any chest pain with these activities.   Upon arrival to [facility], he is chest pain free. He did describe some mild soreness in between his shoulder blades a few hours ago but he thinks this was due to the bed he was laying in. No other symptoms suggestive of dissection.   Patient does have a significant tobacco history and currently smokes 1.5 packs of cigarettes per day. He also has a history of alcohol abuse and reported drinking on average four to five 16 oz cans of beer per day (although to RN he reported drinking four to seven 24 oz cans per day). He also reports occasional marijuana use (UDS was positive for this). He has a couple of paternal aunts with heart disease but he was unable to specify exactly what. No family history of early CAD in parents or siblings.  Past Medical History:  Diagnosis Date   ETOH abuse     History reviewed. No pertinent surgical history.   Home Medications:  Prior to Admission  medications   Medication Sig Start Date End Date Taking? Authorizing Provider  cephALEXin  (KEFLEX ) 500 MG capsule Take 1 capsule (500 mg total) by mouth 4 (four) times daily. 09/04/16   Arminda Lonell Narrow, MD  ondansetron  (ZOFRAN  ODT) 4 MG disintegrating tablet Take 1 tablet (4 mg total) by mouth every 8 (eight) hours as needed for nausea or vomiting. 09/12/17   Long, Fonda MATSU, MD  oxyCODONE  (ROXICODONE ) 5 MG immediate release tablet Take 0.5-1 tablets (2.5-5 mg total) by mouth every 4 (four) hours as needed for severe pain.  03/05/23   Arloa Chroman, PA-C    Scheduled Meds:  [START ON 08/23/2024] aspirin  EC  81 mg Oral Daily   [START ON 08/23/2024] atorvastatin   80 mg Oral Daily   folic acid   1 mg Oral Daily   nicotine   21 mg Transdermal Daily   pantoprazole   40 mg Oral BID   thiamine  (VITAMIN B1) injection  100 mg Intravenous Daily   Continuous Infusions:  sodium chloride      Followed by   sodium chloride      heparin  1,400 Units/hr (08/22/24 0408)   PRN Meds: acetaminophen  **OR** acetaminophen , LORazepam , morphine  injection, nitroGLYCERIN , ondansetron  **OR** ondansetron  (ZOFRAN ) IV, oxyCODONE   Allergies:   No Known Allergies  Social History:   Social History   Socioeconomic History   Marital status: Married    Spouse name: Not on file   Number of children: Not on file   Years of education: Not on file   Highest education level: Not on file  Occupational History   Not on file  Tobacco Use   Smoking status: Every Day    Current packs/day: 1.00    Types: Cigarettes   Smokeless tobacco: Never  Substance and Sexual Activity   Alcohol use: Yes   Drug use: Not Currently    Types: Marijuana   Sexual activity: Not on file  Other Topics Concern   Not on file  Social History Narrative   Not on file   Social Drivers of Health   Financial Resource Strain: Not on file  Food Insecurity: Patient Declined (08/22/2024)   Hunger Vital Sign    Worried About Running Out of Food in the Last Year: Patient declined    Ran Out of Food in the Last Year: Patient declined  Transportation Needs: Patient Declined (08/22/2024)   PRAPARE - Administrator, Civil Service (Medical): Patient declined    Lack of Transportation (Non-Medical): Patient declined  Physical Activity: Not on file  Stress: Not on file  Social Connections: Patient Declined (08/22/2024)   Social Connection and Isolation Panel    Frequency of Communication with Friends and Family: Patient declined    Frequency of Social  Gatherings with Friends and Family: Patient declined    Attends Religious Services: Patient declined    Database administrator or Organizations: Patient declined    Attends Banker Meetings: Patient declined    Marital Status: Patient declined  Intimate Partner Violence: Patient Declined (08/22/2024)   Humiliation, Afraid, Rape, and Kick questionnaire    Fear of Current or Ex-Partner: Patient declined    Emotionally Abused: Patient declined    Physically Abused: Patient declined    Sexually Abused: Patient declined    Family History:   Family History  Problem Relation Age of Onset   Diabetes Mother    Diabetes Father      ROS:  Review of Systems  Cardiovascular:  Positive for chest pain. Negative for claudication,  irregular heartbeat, leg swelling, near-syncope, orthopnea, palpitations, paroxysmal nocturnal dyspnea and syncope.  Respiratory:  Positive for shortness of breath.   Hematologic/Lymphatic: Negative for bleeding problem.     Physical Exam/Data: Vitals:   08/22/24 1156 08/22/24 1202 08/22/24 1207 08/22/24 1225  BP:   128/87 129/89  Pulse:  75    Resp:  16    Temp:  98.1 F (36.7 C)    TempSrc:  Oral    SpO2:  98%    Weight: 108.4 kg     Height: 6' 3 (1.905 m)      No intake or output data in the 24 hours ending 08/22/24 1249    08/22/2024   11:56 AM 08/22/2024    3:08 AM 03/05/2023    1:12 PM  Last 3 Weights  Weight (lbs) 238 lb 14.4 oz 230 lb 225 lb  Weight (kg) 108.364 kg 104.327 kg 102.059 kg     Body mass index is 29.86 kg/m.   Physical Exam per MD: General: 38 y.o. male resting comfortably in no acute distress. HEENT: Normocephalic and atraumatic. Sclera clear. EOMs intact. Neck: Supple. No carotid bruits. No JVD. Heart: RRR. Distinct S1 and S2. No murmurs, gallops, or rubs. Radial pulses 2+ and equal bilaterally. Lungs: No increased work of breathing. Clear to ausculation bilaterally. No wheezes, rhonchi, or rales.  Extremities: No  lower extremity edema.    Skin: Warm and dry. Neuro: Alert and oriented x3. No focal deficits. Psych: Normal affect. Responds appropriately.    EKG:  The EKG was personally reviewed and demonstrates:  Normal sinus rhythm with T wave inversions in lead aVL.   Relevant CV Studies: Echo pending.  Laboratory Data: High Sensitivity Troponin:   Recent Labs  Lab 08/22/24 0310 08/22/24 0547 08/22/24 1004  TROPONINIHS 2,728* 9,659* 21,047*     Chemistry Recent Labs  Lab 08/22/24 0310  NA 133*  K 3.8  CL 97*  CO2 22  GLUCOSE 109*  BUN 8  CREATININE 0.91  CALCIUM  8.7*  MG 2.0  GFRNONAA >60  ANIONGAP 14    Recent Labs  Lab 08/22/24 0310  PROT 7.0  ALBUMIN 3.7  AST 54*  ALT 41  ALKPHOS 79  BILITOT 0.7   Lipids  Recent Labs  Lab 08/22/24 1033  CHOL 270*  TRIG 267*  HDL 46  LDLCALC 171*  CHOLHDL 5.9    Hematology Recent Labs  Lab 08/22/24 0310  WBC 9.8  RBC 4.51  HGB 15.8  HCT 45.2  MCV 100.2*  MCH 35.0*  MCHC 35.0  RDW 13.1  PLT 218   Thyroid No results for input(s): TSH, FREET4 in the last 168 hours.  BNPNo results for input(s): BNP, PROBNP in the last 168 hours.  DDimer No results for input(s): DDIMER in the last 168 hours.  Radiology/Studies:  DG Chest Portable 1 View Result Date: 08/22/2024 CLINICAL DATA:  Chest pain EXAM: PORTABLE CHEST 1 VIEW COMPARISON:  03/05/2023 FINDINGS: Cardiac shadow is stable. The lungs are well aerated bilaterally. No focal infiltrate or effusion is seen. No bony abnormality is noted. IMPRESSION: No active disease. Electronically Signed   By: Oneil Devonshire M.D.   On: 08/22/2024 03:29     Assessment and Plan:  NSTEMI Patient presented with sudden onset of lower substernal chest soreness. EKG showed normal sinus rhythm with T wave inversions in lead aVL. High-sensitivity troponin elevated at 2,728 >> 9,659 >> 21,047. Sed Rate normal. CRP pending.  - Currently chest pain free. - Echo has  been ordered and is  pending. - Continue IV Heparin .  - He has already received 324mg  of Aspirin  today. Start 81mg  daily tomorrow. - He has been started on Lipitor 40mg  daily. Will go ahead an increase to 80mg  daily given markedly elevated LDL. - Symptoms sound more atypical. However, he does have CV risk factors including untreated hyperlipidemia and significant ETOH and tobacco abuse. Given markedly elevated troponin and his young age, will proceed with cardiac catheterization today. If this does not show any obstructive disease, will plan for cardiac MRI tomorrow to assess for myopericarditis.   Shared Decision Making/Informed Consent{ The risks [stroke (1 in 1000), death (1 in 1000), kidney failure [usually temporary] (1 in 500), bleeding (1 in 200), allergic reaction [possibly serious] (1 in 200)], benefits (diagnostic support and management of coronary artery disease) and alternatives of a cardiac catheterization were discussed in detail with Mathew Palmer and he is willing to proceed.    Hyperlipidemia New diagnosis this admission. Lipid panel: Total Cholesterol 270, Triglycerides 267, HDL 46, LDL 171.  - He has been started on Lipitor 40mg  daily. Will go ahead an increase to 80mg  daily given markedly elevated LDL. - Will need repeat lipid panel and LFT in 6-8 weeks.  Tobacco Abuse Patient currently smokes 1.5 packs per day.  - Continue Nicotine  patch.  - Will need to continue to counsel patient on importance of complete cessation.  Otherwise, per primary team: - Alcohol abuse: on CIWA protocol - Hyponatremia   Risk Assessment/Risk Scores:  TIMI Risk Score for Unstable Angina or Non-ST Elevation MI:   The patient's TIMI risk score is 2, which indicates a 8% risk of all cause mortality, new or recurrent myocardial infarction or need for urgent revascularization in the next 14 days.{  For questions or updates, please contact [facility] Please consult [email or web address] for contact info under    Signed, Callie E Goodrich, PA-C  08/22/2024 12:49 PM  ADDENDUM:   Patient seen and examined with Ms. Jadine.  I personally taken a history, examined the patient, reviewed relevant notes,  laboratory data / imaging studies.  I performed a substantive portion of this encounter and formulated the important aspects of the plan.  I agree with the APP's note, impression, and recommendations; however, I have edited the note to reflect changes or salient points.   Patient seen and examined at bedside when he arrived to [facility] from [facility].  Girlfriend at bedside No active chest pain. Not in congestive heart failure. Hemodynamically stable.  Had chest pain onset yesterday with substernal discomfort, diaphoresis, nausea and vomiting. Initially did not think much of it as he significantly drinks alcohol. However, since the pain lingered his girlfriend convinced him to come to the ED for further evaluation. Patient was admitted to [facility] under hospitalist service for NSTEMI workup. Due to rising troponins and nonspecific EKG changes was brought to [facility] for further evaluation and management.  PHYSICAL EXAM: Today's Vitals   08/22/24 1156 08/22/24 1202 08/22/24 1207 08/22/24 1225  BP:   128/87 129/89  Pulse:  75    Resp:  16    Temp:  98.1 F (36.7 C)    TempSrc:  Oral    SpO2:  98%    Weight: 108.4 kg     Height: 6' 3 (1.905 m)     PainSc: 0-No pain 0-No pain     Body mass index is 29.86 kg/m.   Net IO Since Admission: No IO data has been entered for  this period [08/22/24 1422]  Filed Weights   08/22/24 0308 08/22/24 1156  Weight: 104.3 kg 108.4 kg    Physical Exam  Constitutional: No distress.  hemodynamically stable  Neck: No JVD present.  Cardiovascular: Normal rate, regular rhythm, S1 normal and S2 normal. Exam reveals no gallop, no S3 and no S4.  No murmur heard. Pulmonary/Chest: Effort normal and breath sounds normal. No stridor. He has no wheezes. He has no rales.   Musculoskeletal:        General: No edema.     Cervical back: Neck supple.  Skin: Skin is warm.    EKG: (personally reviewed by me) 08/22/2024 -underlying rhythm is sinus, change in axis and high lateral leads, isolated TWI in aVL.  No STEMI criteria.  Telemetry: (personally reviewed by me) Sinus rhythm -just arrived so limited data   Impression & Recommendations: :  NSTEMI Hyperlipidemia, pure Alcohol abuse Cigarette smoking Family history of heart disease  Patient was transferred from [facility] due to rising troponin.  When he arrived to [facility] his troponins were greater than 21,000. Clinically he denied having active anginal chest pain or heart failure symptoms. Currently on IV heparin  drip. Chest pain did improve with any pain with nitroglycerin . And index event occurred yesterday. Given his young age, multiple cardiovascular risk factors, and rising troponins concern for underlining coronary artery disease. Reached out to Dr. Anner who is interventional cardiology on-call and discussed this case.  Shared decision was to recommend early invasive strategy with heart catheterization later today.  Patient agreeable with the plan of care after discussing the risks, benefits, alternatives.  One of the EKGs do illustrate subtle PR depressions in the inferior leads and subtle PR elevation in aVR.  Cannot entirely rule out myopericarditis.  If left heart catheterization is unremarkable would recommend cardiac MRI for further evaluation and management.  Patient denies any sick contacts at home or work.  But he does use marijuana which may be contributory.  Reemphasize importance of complete smoking cessation.  Will start aspirin  and statin therapy.  Will need outpatient follow-up.  Plan of care discussed with patient, his girlfriend, nursing staff, interventional cardiology Dr. Anner.  Medical Decision:  Complexity: High Interdisciplinary: yes Independently  reviewed: EKG, labs,  Prescription drug management -recommendations stated above Family updated: yes, girlfriend as per patient request   Further recommendations to follow as the case evolves.   This note was created using a voice recognition software as a result there may be grammatical errors inadvertently enclosed that do not reflect the nature of this encounter. Every attempt is made to correct such errors.   Madonna Michele HAS, [facility]  A Division of [facility] [address] [place], [place] [address]  Pager: [phone] Office: [phone] 08/22/2024 2:22 PM

## 2024-08-22 NOTE — ED Provider Notes (Signed)
 [facility] Provider Note   CSN: [id number] Arrival date & time: 08/22/24  9742     Patient presents with: Chest Pain   Mathew Palmer is a 38 y.o. male.   HPI Patient presents for chest pain.  Medical history includes alcohol abuse.  Earlier today, he was in his normal state of health.  He went to the park with his son.  He did not exert himself too much.  When he got home, at around 6 PM, he felt a soreness across his entire chest.  He attributed this to coughing.  He laid down to rest.  Although his chest discomfort did decrease in severity, it has been persistent.  He presents to the ED at the behest of his significant other.  Patient does smoke.  He does not have any known first-degree relatives with early ACS.  He does not take any daily medications. He does continue to drink, around 5 drinks per day.  Yesterday, he did have his normal alcohol intake.     Prior to Admission medications   Medication Sig Start Date End Date Taking? Authorizing Provider  cephALEXin  (KEFLEX ) 500 MG capsule Take 1 capsule (500 mg total) by mouth 4 (four) times daily. 09/04/16   Arminda Lonell Narrow, MD  ondansetron  (ZOFRAN  ODT) 4 MG disintegrating tablet Take 1 tablet (4 mg total) by mouth every 8 (eight) hours as needed for nausea or vomiting. 09/12/17   Long, Fonda MATSU, MD  oxyCODONE  (ROXICODONE ) 5 MG immediate release tablet Take 0.5-1 tablets (2.5-5 mg total) by mouth every 4 (four) hours as needed for severe pain. 03/05/23   Harris, Abigail, PA-C    Allergies: Patient has no known allergies.    Review of Systems  Cardiovascular:  Positive for chest pain.  All other systems reviewed and are negative.   Updated Vital Signs BP 124/88   Pulse 70   Temp 98.7 F (37.1 C)   Resp 14   Ht 6' 3 (1.905 m)   Wt 104.3 kg   SpO2 97%   BMI 28.75 kg/m   Physical Exam Vitals and nursing note reviewed.  Constitutional:      General: He is not in acute distress.     Appearance: He is well-developed. He is not ill-appearing, toxic-appearing or diaphoretic.  HENT:     Head: Normocephalic and atraumatic.  Eyes:     Conjunctiva/sclera: Conjunctivae normal.  Cardiovascular:     Rate and Rhythm: Normal rate and regular rhythm.     Heart sounds: No murmur heard. Pulmonary:     Effort: Pulmonary effort is normal. No respiratory distress.     Breath sounds: Normal breath sounds.  Chest:     Chest wall: No tenderness.  Abdominal:     Palpations: Abdomen is soft.     Tenderness: There is no abdominal tenderness.  Musculoskeletal:        General: No swelling. Normal range of motion.     Cervical back: Normal range of motion and neck supple.  Skin:    General: Skin is warm and dry.     Coloration: Skin is not cyanotic or pale.  Neurological:     General: No focal deficit present.     Mental Status: He is alert and oriented to person, place, and time.  Psychiatric:        Mood and Affect: Mood normal.        Behavior: Behavior normal.     (all  labs ordered are listed, but only abnormal results are displayed) Labs Reviewed  COMPREHENSIVE METABOLIC PANEL WITH GFR - Abnormal; Notable for the following components:      Result Value   Sodium 133 (*)    Chloride 97 (*)    Glucose, Bld 109 (*)    Calcium  8.7 (*)    AST 54 (*)    All other components within normal limits  CBC WITH DIFFERENTIAL/PLATELET - Abnormal; Notable for the following components:   MCV 100.2 (*)    MCH 35.0 (*)    All other components within normal limits  TROPONIN I (HIGH SENSITIVITY) - Abnormal; Notable for the following components:   Troponin I (High Sensitivity) 2,728 (*)    All other components within normal limits  MAGNESIUM  LIPASE, BLOOD  HEPARIN  LEVEL (UNFRACTIONATED)    EKG: None  Radiology: DG Chest Portable 1 View Result Date: 08/22/2024 CLINICAL DATA:  Chest pain EXAM: PORTABLE CHEST 1 VIEW COMPARISON:  03/05/2023 FINDINGS: Cardiac shadow is stable. The  lungs are well aerated bilaterally. No focal infiltrate or effusion is seen. No bony abnormality is noted. IMPRESSION: No active disease. Electronically Signed   By: Oneil Devonshire M.D.   On: 08/22/2024 03:29     Procedures   Medications Ordered in the ED  fentaNYL  (SUBLIMAZE ) injection 100 mcg (has no administration in time range)  heparin  ADULT infusion 100 units/mL (25000 units/250mL) (1,400 Units/hr Intravenous New Bag/Given 08/22/24 0408)  aspirin  chewable tablet 324 mg (324 mg Oral Given 08/22/24 0326)  alum & mag hydroxide-simeth (MAALOX/MYLANTA) 200-200-20 MG/5ML suspension 30 mL (30 mLs Oral Given 08/22/24 0326)    And  lidocaine  (XYLOCAINE ) 2 % viscous mouth solution 15 mL (15 mLs Oral Given 08/22/24 0327)  heparin  bolus via infusion 4,000 Units (4,000 Units Intravenous Bolus from Bag 08/22/24 0408)                                    Medical Decision Making Amount and/or Complexity of Data Reviewed Labs: ordered. Radiology: ordered.  Risk OTC drugs. Prescription drug management.   This patient presents to the ED for concern of chest pain, this involves an extensive number of treatment options, and is a complaint that carries with it a high risk of complications and morbidity.  The differential diagnosis includes ACS, pericarditis, GERD, anxiety   Co morbidities / Chronic conditions that complicate the patient evaluation  Fall abuse   Additional history obtained:  Additional history obtained from EMR External records from outside source obtained and reviewed including N/A   Lab Tests:  I Ordered, and personally interpreted labs.  The pertinent results include: Normal hemoglobin, no leukocytosis, normal kidney function, normal electrolytes.  Troponin elevated consistent with NSTEMI   Imaging Studies ordered:  I ordered imaging studies including chest x-ray I independently visualized and interpreted imaging which showed no acute findings I agree with the  radiologist interpretation   Cardiac Monitoring: / EKG:  The patient was maintained on a cardiac monitor.  I personally viewed and interpreted the cardiac monitored which showed an underlying rhythm of: Sinus rhythm   Problem List / ED Course / Critical interventions / Medication management  Patient presenting for chest pain.  Onset was 6 PM.  This was as he returned home from being at the park with his son.  His discomfort has diminished, but significant other talked him into coming in for evaluation.  Patient denies  any associated symptoms, although he did have an episode of vomiting yesterday morning.  EKG shows normal sinus rhythm with no concerning ST segment elevations.  Workup was initiated.  Patient was given ASA in addition to GI cocktail.  Patient initial troponin was markedly elevated at 2728.  Heparin  was ordered.  On reassessment, patient's chest pain remains a 4/10 in severity.  Fentanyl  was ordered for analgesia.  I spoke with cardiologist on-call, Dr. Cesario, who requests hospitalist admission to manage potential of alcohol withdrawal.  Cardiology will plan on cath.  Patient was admitted for further management. I ordered medication including ASA and heparin  for ACS; fentanyl  for analgesia Reevaluation of the patient after these medicines showed that the patient improved I have reviewed the patients home medicines and have made adjustments as needed   Consultations Obtained:  I requested consultation with the cardiologist, Dr. Cesario,  and discussed lab and imaging findings as well as pertinent plan - they recommend: Hospitalist admission to [facility].   Social Determinants of Health:  Lives independently  CRITICAL CARE Performed by: Bernardino Fireman   Total critical care time: 32 minutes  Critical care time was exclusive of separately billable procedures and treating other patients.  Critical care was necessary to treat or prevent imminent or life-threatening  deterioration.  Critical care was time spent personally by me on the following activities: development of treatment plan with patient and/or surrogate as well as nursing, discussions with consultants, evaluation of patient's response to treatment, examination of patient, obtaining history from patient or surrogate, ordering and performing treatments and interventions, ordering and review of laboratory studies, ordering and review of radiographic studies, pulse oximetry and re-evaluation of patient's condition.      Final diagnoses:  NSTEMI (non-ST elevated myocardial infarction) [facility])    ED Discharge Orders     None          Fireman Bernardino, MD 08/22/24 [phone]

## 2024-08-22 NOTE — ED Triage Notes (Signed)
 Pt with c/o mid chest pain that he describes as feeling like gas that started while he was at the park yesterday. Pt states he vomited once.

## 2024-08-22 NOTE — Consult Note (Addendum)
 Cardiology Consultation   Patient ID: [name] MRN: [id number]; DOB: [birth year]  Admit date: 08/22/2024 Date of Consult: 08/22/2024  PCP:  Patient, No Pcp Per   [facility] Providers Cardiologist:  None     Dr. Michele (new) Requesting Physician: Dr. Melvenia Reason: Chest Pain and NSTEMI   Patient Profile: Mathew Palmer is a 38 y.o. male with a history of polysubstance abuse (tobacco, alcohol, and marijuana) who is being seen 08/22/2024 for the evaluation of NSTEMI at the request of Dr Melvenia.  History of Present Illness: Mr. Mathew Palmer is a 38 year old male with the above history. No known prior cardiac history. He denies any history of hypertension, hyperlipidemia, or diabetes but does not regularly go to the doctor. He does not have a PCP and goes to an Urgent Care if he has an issues. He went to an Urgent Care a few weeks ago for a tooth infection and was treated with antibiotics.   He presented to the [facility] ED earlier this morning for further evaluation of chest pain. Upon arrival to the ED, EKG showed normal sinus rhythm, rate 72 bpm, with T wave inversions in lead aVL. High-sensitivity troponin elevated at 2,728 >> 9,659. Chest x-ray showed no acute findings. WBC 9.8, Hgb 15.8, Plts 218. Na 133, K 3.8, Glucose 109, BUN 8, Cr 0.91. Albumin 3.7, AST 54, ALT 41, Alk Phos 79, Total Bili 0.7. Lipase normal. Magnesium 2.0. Sed Rate normal. CRP pending. Urine drug screen positive for THC. ETOH level negative. He was started on IV Heparin  and admitted under the medicine service. He was transferred to [facility] and Cardiology consulted for further evaluation.   Patient was in his usual state of health until yesterday when he developed sudden onset of lower substernal chest soreness around 7:30pm after watching his son play at the park. He states this occurred after a mild coughing episode. The pain was got as bad as a 6-7/10 on the pain scale and his girlfriend convinced him to go  to the ED. Earlier yesterday afternoon (around 2-3pm), he also had an isolated episode of vomiting that occurred after he was drinking a lot and got warm and flushed.  Otherwise, no associated symptoms. No shortness of breath, orthopnea, PND, edema, palpitations, lightheadedness, dizziness, or syncope. No recent fevers or illnesses other than recent dental infection. No abnormal bleeding in urine or stools.  Patient has an active job and works in Therapist, music care. He denies any chest pain with these activities.   Upon arrival to [facility], he is chest pain free. He did describe some mild soreness in between his shoulder blades a few hours ago but he thinks this was due to the bed he was laying in. No other symptoms suggestive of dissection.   Patient does have a significant tobacco history and currently smokes 1.5 packs of cigarettes per day. He also has a history of alcohol abuse and reported drinking on average four to five 16 oz cans of beer per day (although to RN he reported drinking four to seven 24 oz cans per day). He also reports occasional marijuana use (UDS was positive for this). He has a couple of paternal aunts with heart disease but he was unable to specify exactly what. No family history of early CAD in parents or siblings.  Past Medical History:  Diagnosis Date   ETOH abuse     History reviewed. No pertinent surgical history.   Home Medications:  Prior to Admission  medications   Medication Sig Start Date End Date Taking? Authorizing Provider  cephALEXin  (KEFLEX ) 500 MG capsule Take 1 capsule (500 mg total) by mouth 4 (four) times daily. 09/04/16   Arminda Lonell Narrow, MD  ondansetron  (ZOFRAN  ODT) 4 MG disintegrating tablet Take 1 tablet (4 mg total) by mouth every 8 (eight) hours as needed for nausea or vomiting. 09/12/17   Long, Fonda MATSU, MD  oxyCODONE  (ROXICODONE ) 5 MG immediate release tablet Take 0.5-1 tablets (2.5-5 mg total) by mouth every 4 (four) hours as needed for severe pain.  03/05/23   Arloa Chroman, PA-C    Scheduled Meds:  [START ON 08/23/2024] aspirin  EC  81 mg Oral Daily   [START ON 08/23/2024] atorvastatin   80 mg Oral Daily   folic acid   1 mg Oral Daily   nicotine   21 mg Transdermal Daily   pantoprazole   40 mg Oral BID   thiamine  (VITAMIN B1) injection  100 mg Intravenous Daily   Continuous Infusions:  sodium chloride      Followed by   sodium chloride      heparin  1,400 Units/hr (08/22/24 0408)   PRN Meds: acetaminophen  **OR** acetaminophen , LORazepam , morphine  injection, nitroGLYCERIN , ondansetron  **OR** ondansetron  (ZOFRAN ) IV, oxyCODONE   Allergies:   No Known Allergies  Social History:   Social History   Socioeconomic History   Marital status: Married    Spouse name: Not on file   Number of children: Not on file   Years of education: Not on file   Highest education level: Not on file  Occupational History   Not on file  Tobacco Use   Smoking status: Every Day    Current packs/day: 1.00    Types: Cigarettes   Smokeless tobacco: Never  Substance and Sexual Activity   Alcohol use: Yes   Drug use: Not Currently    Types: Marijuana   Sexual activity: Not on file  Other Topics Concern   Not on file  Social History Narrative   Not on file   Social Drivers of Health   Financial Resource Strain: Not on file  Food Insecurity: Patient Declined (08/22/2024)   Hunger Vital Sign    Worried About Running Out of Food in the Last Year: Patient declined    Ran Out of Food in the Last Year: Patient declined  Transportation Needs: Patient Declined (08/22/2024)   PRAPARE - Administrator, Civil Service (Medical): Patient declined    Lack of Transportation (Non-Medical): Patient declined  Physical Activity: Not on file  Stress: Not on file  Social Connections: Patient Declined (08/22/2024)   Social Connection and Isolation Panel    Frequency of Communication with Friends and Family: Patient declined    Frequency of Social  Gatherings with Friends and Family: Patient declined    Attends Religious Services: Patient declined    Database administrator or Organizations: Patient declined    Attends Banker Meetings: Patient declined    Marital Status: Patient declined  Intimate Partner Violence: Patient Declined (08/22/2024)   Humiliation, Afraid, Rape, and Kick questionnaire    Fear of Current or Ex-Partner: Patient declined    Emotionally Abused: Patient declined    Physically Abused: Patient declined    Sexually Abused: Patient declined    Family History:   Family History  Problem Relation Age of Onset   Diabetes Mother    Diabetes Father      ROS:  Review of Systems  Cardiovascular:  Positive for chest pain. Negative for claudication,  irregular heartbeat, leg swelling, near-syncope, orthopnea, palpitations, paroxysmal nocturnal dyspnea and syncope.  Respiratory:  Positive for shortness of breath.   Hematologic/Lymphatic: Negative for bleeding problem.     Physical Exam/Data: Vitals:   08/22/24 1156 08/22/24 1202 08/22/24 1207 08/22/24 1225  BP:   128/87 129/89  Pulse:  75    Resp:  16    Temp:  98.1 F (36.7 C)    TempSrc:  Oral    SpO2:  98%    Weight: 108.4 kg     Height: 6' 3 (1.905 m)      No intake or output data in the 24 hours ending 08/22/24 1249    08/22/2024   11:56 AM 08/22/2024    3:08 AM 03/05/2023    1:12 PM  Last 3 Weights  Weight (lbs) 238 lb 14.4 oz 230 lb 225 lb  Weight (kg) 108.364 kg 104.327 kg 102.059 kg     Body mass index is 29.86 kg/m.   Physical Exam per MD: General: 38 y.o. male resting comfortably in no acute distress. HEENT: Normocephalic and atraumatic. Sclera clear. EOMs intact. Neck: Supple. No carotid bruits. No JVD. Heart: RRR. Distinct S1 and S2. No murmurs, gallops, or rubs. Radial pulses 2+ and equal bilaterally. Lungs: No increased work of breathing. Clear to ausculation bilaterally. No wheezes, rhonchi, or rales.  Extremities: No  lower extremity edema.    Skin: Warm and dry. Neuro: Alert and oriented x3. No focal deficits. Psych: Normal affect. Responds appropriately.    EKG:  The EKG was personally reviewed and demonstrates:  Normal sinus rhythm with T wave inversions in lead aVL.   Relevant CV Studies: Echo pending.  Laboratory Data: High Sensitivity Troponin:   Recent Labs  Lab 08/22/24 0310 08/22/24 0547 08/22/24 1004  TROPONINIHS 2,728* 9,659* 21,047*     Chemistry Recent Labs  Lab 08/22/24 0310  NA 133*  K 3.8  CL 97*  CO2 22  GLUCOSE 109*  BUN 8  CREATININE 0.91  CALCIUM  8.7*  MG 2.0  GFRNONAA >60  ANIONGAP 14    Recent Labs  Lab 08/22/24 0310  PROT 7.0  ALBUMIN 3.7  AST 54*  ALT 41  ALKPHOS 79  BILITOT 0.7   Lipids  Recent Labs  Lab 08/22/24 1033  CHOL 270*  TRIG 267*  HDL 46  LDLCALC 171*  CHOLHDL 5.9    Hematology Recent Labs  Lab 08/22/24 0310  WBC 9.8  RBC 4.51  HGB 15.8  HCT 45.2  MCV 100.2*  MCH 35.0*  MCHC 35.0  RDW 13.1  PLT 218   Thyroid No results for input(s): TSH, FREET4 in the last 168 hours.  BNPNo results for input(s): BNP, PROBNP in the last 168 hours.  DDimer No results for input(s): DDIMER in the last 168 hours.  Radiology/Studies:  DG Chest Portable 1 View Result Date: 08/22/2024 CLINICAL DATA:  Chest pain EXAM: PORTABLE CHEST 1 VIEW COMPARISON:  03/05/2023 FINDINGS: Cardiac shadow is stable. The lungs are well aerated bilaterally. No focal infiltrate or effusion is seen. No bony abnormality is noted. IMPRESSION: No active disease. Electronically Signed   By: Oneil Devonshire M.D.   On: 08/22/2024 03:29     Assessment and Plan:  NSTEMI Patient presented with sudden onset of lower substernal chest soreness. EKG showed normal sinus rhythm with T wave inversions in lead aVL. High-sensitivity troponin elevated at 2,728 >> 9,659 >> 21,047. Sed Rate normal. CRP pending.  - Currently chest pain free. - Echo has  been ordered and is  pending. - Continue IV Heparin .  - He has already received 324mg  of Aspirin  today. Start 81mg  daily tomorrow. - He has been started on Lipitor 40mg  daily. Will go ahead an increase to 80mg  daily given markedly elevated LDL. - Symptoms sound more atypical. However, he does have CV risk factors including untreated hyperlipidemia and significant ETOH and tobacco abuse. Given markedly elevated troponin and his young age, will proceed with cardiac catheterization today. If this does not show any obstructive disease, will plan for cardiac MRI tomorrow to assess for myopericarditis.   Shared Decision Making/Informed Consent{ The risks [stroke (1 in 1000), death (1 in 1000), kidney failure [usually temporary] (1 in 500), bleeding (1 in 200), allergic reaction [possibly serious] (1 in 200)], benefits (diagnostic support and management of coronary artery disease) and alternatives of a cardiac catheterization were discussed in detail with Mr. Luhman and he is willing to proceed.    Hyperlipidemia New diagnosis this admission. Lipid panel: Total Cholesterol 270, Triglycerides 267, HDL 46, LDL 171.  - He has been started on Lipitor 40mg  daily. Will go ahead an increase to 80mg  daily given markedly elevated LDL. - Will need repeat lipid panel and LFT in 6-8 weeks.  Tobacco Abuse Patient currently smokes 1.5 packs per day.  - Continue Nicotine  patch.  - Will need to continue to counsel patient on importance of complete cessation.  Otherwise, per primary team: - Alcohol abuse: on CIWA protocol - Hyponatremia   Risk Assessment/Risk Scores:  TIMI Risk Score for Unstable Angina or Non-ST Elevation MI:   The patient's TIMI risk score is 2, which indicates a 8% risk of all cause mortality, new or recurrent myocardial infarction or need for urgent revascularization in the next 14 days.{  For questions or updates, please contact [facility] Please consult [email or web address] for contact info under    Signed, Callie E Goodrich, PA-C  08/22/2024 12:49 PM  ADDENDUM:   Patient seen and examined with Ms. Jadine.  I personally taken a history, examined the patient, reviewed relevant notes,  laboratory data / imaging studies.  I performed a substantive portion of this encounter and formulated the important aspects of the plan.  I agree with the APP's note, impression, and recommendations; however, I have edited the note to reflect changes or salient points.   Patient seen and examined at bedside when he arrived to [facility] from [facility].  Girlfriend at bedside No active chest pain. Not in congestive heart failure. Hemodynamically stable.  Had chest pain onset yesterday with substernal discomfort, diaphoresis, nausea and vomiting. Initially did not think much of it as he significantly drinks alcohol. However, since the pain lingered his girlfriend convinced him to come to the ED for further evaluation. Patient was admitted to [facility] under hospitalist service for NSTEMI workup. Due to rising troponins and nonspecific EKG changes was brought to [facility] for further evaluation and management.  PHYSICAL EXAM: Today's Vitals   08/22/24 1156 08/22/24 1202 08/22/24 1207 08/22/24 1225  BP:   128/87 129/89  Pulse:  75    Resp:  16    Temp:  98.1 F (36.7 C)    TempSrc:  Oral    SpO2:  98%    Weight: 108.4 kg     Height: 6' 3 (1.905 m)     PainSc: 0-No pain 0-No pain     Body mass index is 29.86 kg/m.   Net IO Since Admission: No IO data has been entered for  this period [08/22/24 1422]  Filed Weights   08/22/24 0308 08/22/24 1156  Weight: 104.3 kg 108.4 kg    Physical Exam  Constitutional: No distress.  hemodynamically stable  Neck: No JVD present.  Cardiovascular: Normal rate, regular rhythm, S1 normal and S2 normal. Exam reveals no gallop, no S3 and no S4.  No murmur heard. Pulmonary/Chest: Effort normal and breath sounds normal. No stridor. He has no wheezes. He has no rales.   Musculoskeletal:        General: No edema.     Cervical back: Neck supple.  Skin: Skin is warm.    EKG: (personally reviewed by me) 08/22/2024 -underlying rhythm is sinus, change in axis and high lateral leads, isolated TWI in aVL.  No STEMI criteria.  Telemetry: (personally reviewed by me) Sinus rhythm -just arrived so limited data   Impression & Recommendations: :  NSTEMI Hyperlipidemia, pure Alcohol abuse Cigarette smoking Family history of heart disease  Patient was transferred from [facility] due to rising troponin.  When he arrived to [facility] his troponins were greater than 21,000. Clinically he denied having active anginal chest pain or heart failure symptoms. Currently on IV heparin  drip. Chest pain did improve with any pain with nitroglycerin . And index event occurred yesterday. Given his young age, multiple cardiovascular risk factors, and rising troponins concern for underlining coronary artery disease. Reached out to Dr. Anner who is interventional cardiology on-call and discussed this case.  Shared decision was to recommend early invasive strategy with heart catheterization later today.  Patient agreeable with the plan of care after discussing the risks, benefits, alternatives.  One of the EKGs do illustrate subtle PR depressions in the inferior leads and subtle PR elevation in aVR.  Cannot entirely rule out myopericarditis.  If left heart catheterization is unremarkable would recommend cardiac MRI for further evaluation and management.  Patient denies any sick contacts at home or work.  But he does use marijuana which may be contributory.  Reemphasize importance of complete smoking cessation.  Will start aspirin  and statin therapy.  Will need outpatient follow-up.  Plan of care discussed with patient, his girlfriend, nursing staff, interventional cardiology Dr. Anner.  Medical Decision:  Complexity: High Interdisciplinary: yes Independently  reviewed: EKG, labs,  Prescription drug management -recommendations stated above Family updated: yes, girlfriend as per patient request   Further recommendations to follow as the case evolves.   This note was created using a voice recognition software as a result there may be grammatical errors inadvertently enclosed that do not reflect the nature of this encounter. Every attempt is made to correct such errors.   Madonna Michele HAS, [facility]  A Division of [facility] [address], [place], [place] [address]  Pager: [phone] Office: [phone] 08/22/2024 2:22 PM

## 2024-08-22 NOTE — Hospital Course (Addendum)
 CC: mid chest pain HPI: Mathew Palmer is a 38 y.o. male with medical history significant of alcohol abuse who presented with chest pain.  Patient was at his usual state of health until yesterday evening around 7:30 pm when he experienced precordial chest pain. Sharp in nature, with no radiation, worse with coughing, no improving factors, no associated dyspnea or diaphoresis. At the time the pain started he was at the park watching his son play. His pain was persistent and worsening reaching up to 8/10 in intensity, his girlfriend noted him to be in pain and brought him to the ED.    In the past he had similar pain mainly associated with coughing. 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 He denies any angina, PND, edema or orthopnea.    He drinks alcohol every day but denies any drug use. At the time of my examination his pain is down to 3 to 4/10.   Significant Events: Admitted 08/22/2024 for NSTEMI 08-22-2024 decision made to transfer pt to [facility] for further evaluation including LHC. IV heparin  started. 08-22-2024 seen by cardiology consult. Dr. Michele felt that pt had NSTEMI and needed LHC today. 08-22-2024 taken to cath lab. 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 Had DES placed to mid LAD. Placed on ASA and Effient .  Admission Labs: WBC 9.8, HgB 15.8, plt 218 Lipase 32 Mg 2.0 Na 133, K 3.8, CO2 of 22, BUN 8, Scr 0.91, glu 109 T. Prot 7.0, alb 3.7, AST 54, ALT 41, alk phos 79, t. Bili 0.7 ESR 1 T. Chol 270, HDL 46, LDL 171, TG 267 UDS positive for THC Covid/rsv/flu negative  Admission Imaging Studies: CXR No active disease   Significant Labs: Troponin I 2728 -> 9659 -> 21047 -> 18505  Significant Imaging Studies: Echo shows LVEF 35-40%  Antibiotic Therapy: Anti-infectives (From admission, onward)    None       Procedures: LHC with DES to LAD  Consultants: cardiology

## 2024-08-22 NOTE — H&P (Signed)
 History and Physical    Patient: Mathew Palmer FMW:[id number] DOB: 1986/07/19 DOA: 08/22/2024 DOS: the patient was seen and examined on 08/22/2024 PCP: Patient, No Pcp Per  Patient coming from: Home  Chief Complaint:  Chief Complaint  Patient presents with   Chest Pain   HPI: Mathew Palmer is a 38 y.o. male with medical history significant of alcohol abuse who presented with chest pain.  Patient was at his usual state of health until yesterday evening around 7:30 pm when he experienced precordial chest pain. Sharp in nature, with no radiation, worse with coughing, no improving factors, no associated dyspnea or diaphoresis. At the time the pain started he was at the park watching his son play. His pain was persistent and worsening reaching up to 8/10 in intensity, his girlfriend noted him to be in pain and brought him to the ED.   In the past he had similar pain mainly associated with coughing. 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 He denies any angina, PND, edema or orthopnea.   He drinks alcohol every day but denies any drug use. At the time of my examination his pain is down to 3 to 4/10.    Review of Systems: As mentioned in the history of present illness. All other systems reviewed and are negative. Past Medical History:  Diagnosis Date   ETOH abuse    History reviewed. No pertinent surgical history. Social History:  reports that he has been smoking cigarettes. He has never used smokeless tobacco. He reports current alcohol use. He reports that he does not currently use drugs after having used the following drugs: Marijuana.  No Known Allergies  History reviewed. No pertinent family history.  Prior to Admission medications   Medication Sig Start Date End Date Taking? Authorizing Provider  cephALEXin  (KEFLEX ) 500 MG capsule Take 1 capsule (500 mg total) by mouth 4 (four) times daily. 09/04/16   Arminda Lonell Narrow, MD  ondansetron  (ZOFRAN  ODT) 4 MG disintegrating  tablet Take 1 tablet (4 mg total) by mouth every 8 (eight) hours as needed for nausea or vomiting. 09/12/17   Long, Fonda MATSU, MD  oxyCODONE  (ROXICODONE ) 5 MG immediate release tablet Take 0.5-1 tablets (2.5-5 mg total) by mouth every 4 (four) hours as needed for severe pain. 03/05/23   Arloa Chroman, PA-C    Physical Exam: Vitals:   08/22/24 0316 08/22/24 0320 08/22/24 0330 08/22/24 0400  BP: 120/89  123/89 124/88  Pulse: 70  67 70  Resp: 18  20 14   Temp:      TempSrc:      SpO2: 94% 94% 94% 97%  Weight:      Height:       BP 124/88   Pulse 70   Temp 98.7 F (37.1 C)   Resp 14   Ht 6' 3 (1.905 m)   Wt 104.3 kg   SpO2 97%   BMI 28.75 kg/m   Neurology awake and alert ENT with no pallor or icterus Cardiovascular with S1 and S2 present and regular with no gallops, rubs or murmurs Respiratory with no rales or wheezing, no rhonchi  Abdomen with no distention, soft and non tender No lower extremity edema  Data Reviewed:   Na 133, K 3,8 Cl 97 bicarbonate 22 glucose 109 bun 8 cr 0,91 AST 54 ALT 41  High sensitive troponin 2,728  Wbc 9,8 hgb 15.8 plt 218   Chest radiograph with left rotation, with no infiltrates, no effusions or cardiomegaly  EKG 72 bpm, normal axis, normal intervals, qtc 456, sinus rhythm with no significant ST segment or T wave changes.  (Similar to 2022)   Assessment and Plan: * Chest pain Atypical chest pain, but with very high sensitive troponin.  Currently his chest pain is improving but not chest pain free.   Plan to continue anticoagulation with IV heparin  for possible NSTEMI. For now add aspirin  and statin, until further cardiac work up, including cardiac catheterization.  Pain control with oral oxycodone  and IV morphine .  Continue blood pressure monitoring Follow up echocardiogram and further high sensitive troponin.  Check toxicology screen and alcohol level.  ED has contacted Cardiology in [facility] and recommended admission to [facility] with cardiology  consultation.    Hyponatremia Hyponatremia, likely related to alcohol use.   Plan to continue close follow up renal function and electrolytes Hold on IV fluids for now.  Allow a regular diet.   Alcohol abuse No signs of acute alcohol intoxication, and no signs of acute withdrawal. Plan to add as needed lorazepam  and continue neuro checks per unit protocol.  Add pantoprazole  for GI prophylaxis.    Advance Care Planning:   Code Status: Full Code   Consults: cardiology in [facility]  Family Communication: I spoke with patient's girlfriend at the bedside, we talked in detail about patient's condition, plan of care and prognosis and all questions were addressed.   Severity of Illness: The appropriate patient status for this patient is OBSERVATION. Observation status is judged to be reasonable and necessary in order to provide the required intensity of service to ensure the patient's safety. The patient's presenting symptoms, physical exam findings, and initial radiographic and laboratory data in the context of their medical condition is felt to place them at decreased risk for further clinical deterioration. Furthermore, it is anticipated that the patient will be medically stable for discharge from the hospital within 2 midnights of admission.   Author: Elidia Toribio Furnace, MD 08/22/2024 4:24 AM  For on call review [email or web address].

## 2024-08-22 NOTE — ED Notes (Signed)
 [facility] to give report, charge nurse Burnard informed me that there was no need for report and to send the patient, she's aware that patient is on the way via carelink.

## 2024-08-22 NOTE — Interval H&P Note (Signed)
 History and Physical Interval Note:  08/22/2024 2:47 PM  Mathew Palmer  has presented today for surgery, with the diagnosis of Urgent Non STEMI.  The various methods of treatment have been discussed with the patient and family. After consideration of risks, benefits and other options for treatment, the patient has consented to  Procedure(s): Coronary/Graft Acute MI Revascularization (N/A) LEFT HEART CATH AND CORONARY ANGIOGRAPHY (N/A)  PERCUTANEOUS CORONARY INTERNENTION   as a surgical intervention.  The patient's history has been reviewed, patient examined, no change in status, stable for surgery.  I have reviewed the patient's chart and labs.  Questions were answered to the patient's satisfaction.     Alm Clay

## 2024-08-22 NOTE — Progress Notes (Addendum)
 Progress Note   Patient: Mathew Palmer FMW:[id number] DOB: 1985/12/26 DOA: 08/22/2024     0 DOS: the patient was seen and examined on 08/22/2024   Brief hospital admission narrative: As per H&P written by Dr. Noralee on 08/22/2024 Mathew Palmer is a 38 y.o. male with medical history significant of alcohol abuse who presented with chest pain.  Patient was at his usual state of health until yesterday evening around 7:30 pm when he experienced precordial chest pain. Sharp in nature, with no radiation, worse with coughing, no improving factors, no associated dyspnea or diaphoresis. At the time the pain started he was at the park watching his son play. His pain was persistent and worsening reaching up to 8/10 in intensity, his girlfriend noted him to be in pain and brought him to the ED.    In the past he had similar pain mainly associated with coughing. 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 He denies any angina, PND, edema or orthopnea.    He drinks alcohol every day but denies any drug use. At the time of my examination his pain is down to 3 to 4/10.     Assessment and plan 1-Chest pain -Atypical chest pain, but with very high sensitive troponin and some typical symptoms.  Patient also sprays improvement in discomfort after receiving nitroglycerin  and morphine  in the ED..  Currently his chest pain is improving but not chest pain free.  - After discussing with cardiology service decision-was made to transfer to [facility] for further workup including cardiac catheterization. - Patient has been started on IV heparin  for presumed NSTEMI; aspirin  and statin also initiated - Will follow results for cholesterol level, 2-D echo and A1c and continue to monitor patient blood pressure. - Alcohol and tobacco cessation counseling has been provided. - Follow clinical response. - As needed analgesic and nitroglycerin  for pain will continue to be provided.  Hyponatremia - Most  likely related to alcohol use - Cessation counseling provided - Follow electrolytes trend - Continue monitoring. - Patient asymptomatic.  Tobacco abuse - Cessation counseling provided -Nicotine  patch will be provided. -I have discussed tobacco cessation with the patient.  I have counseled the patient regarding the negative impacts of continued tobacco use including but not limited to lung cancer, COPD, and cardiovascular disease.  I have discussed alternatives to tobacco and modalities that may help facilitate tobacco cessation including but not limited to biofeedback, hypnosis, and medications.  Total time spent with tobacco counseling was 5 minutes.   Alcohol abuse - No acute withdrawal symptoms currently appreciated - CIWA protocol in place - Folic and thiamine  will be provided - Cessation counseling provided - PPI twice a day has been started.  Subjective:  Afebrile, no nausea, no shortness of breath, abdominal pain or oxygen desaturation currently appreciated.  Patient expressed some improvement in his chest discomfort and his previous episode of vomiting earlier on his arrival to the ED.  Physical Exam: Vitals:   08/22/24 0330 08/22/24 0400 08/22/24 0700 08/22/24 0800  BP: 123/89 124/88 (!) 132/92 (!) 137/92  Pulse: 67 70 70 71  Resp: 20 14    Temp:      TempSrc:      SpO2: 94% 97% 92% 94%  Weight:      Height:       General exam: Alert, awake, oriented x 3; in no major distress. Respiratory system: Clear to auscultation. Respiratory effort normal.  No using accessory muscle. Cardiovascular system:RRR. No rubs,  gallops or JVD. Gastrointestinal system: Abdomen is nondistended, soft and nontender.  Positive bowel sounds. Central nervous system: Alert and oriented. No focal neurological deficits. Extremities: No C/C/E, +pedal pulses Skin: No rashes, lesions or ulcers Psychiatry: Judgement and insight appear normal. Mood & affect appropriate.    Data Reviewed: Lipase:  32 Magnesium: 2.0 Comprehensive metabolic panel: Sodium 133, potassium 3.8, chloride 97, bicarb 22, BUN 8, creatinine 0.91, AST 54, ALT 41 and GFR >60 High sensitive troponin: 2728 RAR:Typuz blood cells 9.8, hemoglobin 15.8, platelet count 218K  Family Communication: Wife at bedside.  Disposition: Status is: Observation The patient remains OBS appropriate and will d/c before 2 midnights.  Anticipating discharge back home once medically stable.  Time spent: 50 minutes  Author: Eric Nunnery, MD 08/22/2024 9:02 AM  For on call review [email or web address].

## 2024-08-22 NOTE — Progress Notes (Signed)
 PHARMACY - ANTICOAGULATION CONSULT NOTE  Pharmacy Consult for heparin  Indication: chest pain/ACS  No Known Allergies  Patient Measurements: Height: 6' 3 (190.5 cm) Weight: 104.3 kg (230 lb) IBW/kg (Calculated) : 84.5 HEPARIN  DW (KG): 104.3  Vital Signs: Temp: 98.7 F (37.1 C) (09/14 0312) Temp Source: Oral (09/14 0311) BP: 120/89 (09/14 0316) Pulse Rate: 70 (09/14 0316)  Labs: Recent Labs    08/22/24 0310  HGB 15.8  HCT 45.2  PLT 218  CREATININE 0.91  TROPONINIHS 2,728*    Estimated Creatinine Clearance: 145.3 mL/min (by C-G formula based on SCr of 0.91 mg/dL).   Medical History: Past Medical History:  Diagnosis Date   ETOH abuse    Assessment: 51 yoM presented with chest pain. Pharmacy consulted to dose heparin  for ACS.  -CBC stable -No oral anticoagulation reported -Trops 2700  Goal of Therapy:  Heparin  level 0.3-0.7 units/ml Monitor platelets by anticoagulation protocol: Yes   Plan:  Give 4000 units bolus x 1 Start heparin  infusion at 1400 units/hr Check anti-Xa level in 6 hours and daily while on heparin  Continue to monitor H&H and platelets  Lynwood Poplar, PharmD, BCPS Clinical Pharmacist 08/22/2024 4:04 AM

## 2024-08-22 NOTE — ED Notes (Signed)
 Report given to care link, awaiting their arrival

## 2024-08-22 NOTE — Assessment & Plan Note (Addendum)
 08-23-2024 placed on CIWA. No seizures or withdrawals. Pt advised to stop drinking all forms of etoh.

## 2024-08-22 NOTE — ED Notes (Signed)
 Date and time results received: 08/22/24 1150 (use smartphrase .now to insert current time)  Test: troponin Critical Value: 21047  Name of Provider Notified: Dr. Ricky  Orders Received? Or Actions Taken?:

## 2024-08-22 NOTE — Assessment & Plan Note (Addendum)
 08-23-2024 likely due to etoh use. Resolved without incident. Na 136 today.

## 2024-08-22 NOTE — Progress Notes (Signed)
 Patient has arrive to [address]. Patient is independent, and ambulatory with steady gait. He denies chest pain, dizziness, light headiness, and SOB.   He endorses drinking 4-7 24oz cans of beer daily. His last alcoholic intake is reported to be 08/21/2024 around 4pm.  Admission vitals:  HR 73, NSR BP 128/87 Temp. 98.1 (oral)

## 2024-08-23 ENCOUNTER — Other Ambulatory Visit (HOSPITAL_COMMUNITY): Payer: Self-pay

## 2024-08-23 ENCOUNTER — Inpatient Hospital Stay (HOSPITAL_COMMUNITY)

## 2024-08-23 DIAGNOSIS — F1010 Alcohol abuse, uncomplicated: Secondary | ICD-10-CM | POA: Diagnosis not present

## 2024-08-23 DIAGNOSIS — I255 Ischemic cardiomyopathy: Secondary | ICD-10-CM | POA: Diagnosis not present

## 2024-08-23 DIAGNOSIS — F1210 Cannabis abuse, uncomplicated: Secondary | ICD-10-CM

## 2024-08-23 DIAGNOSIS — R079 Chest pain, unspecified: Secondary | ICD-10-CM

## 2024-08-23 DIAGNOSIS — Z955 Presence of coronary angioplasty implant and graft: Secondary | ICD-10-CM

## 2024-08-23 DIAGNOSIS — I214 Non-ST elevation (NSTEMI) myocardial infarction: Secondary | ICD-10-CM | POA: Diagnosis not present

## 2024-08-23 DIAGNOSIS — F17210 Nicotine dependence, cigarettes, uncomplicated: Secondary | ICD-10-CM | POA: Diagnosis not present

## 2024-08-23 DIAGNOSIS — E871 Hypo-osmolality and hyponatremia: Secondary | ICD-10-CM | POA: Diagnosis not present

## 2024-08-23 LAB — CBC
HCT: 43.4 % (ref 39.0–52.0)
Hemoglobin: 14.8 g/dL (ref 13.0–17.0)
MCH: 34.8 pg — ABNORMAL HIGH (ref 26.0–34.0)
MCHC: 34.1 g/dL (ref 30.0–36.0)
MCV: 102.1 fL — ABNORMAL HIGH (ref 80.0–100.0)
Platelets: 181 K/uL (ref 150–400)
RBC: 4.25 MIL/uL (ref 4.22–5.81)
RDW: 13.2 % (ref 11.5–15.5)
WBC: 7.1 K/uL (ref 4.0–10.5)
nRBC: 0 % (ref 0.0–0.2)

## 2024-08-23 LAB — ECHOCARDIOGRAM COMPLETE
Area-P 1/2: 2.91 cm2
Height: 75 in
S' Lateral: 3.72 cm
Weight: 3822.4 [oz_av]

## 2024-08-23 LAB — BASIC METABOLIC PANEL WITH GFR
Anion gap: 10 (ref 5–15)
BUN: 10 mg/dL (ref 6–20)
CO2: 23 mmol/L (ref 22–32)
Calcium: 8.6 mg/dL — ABNORMAL LOW (ref 8.9–10.3)
Chloride: 105 mmol/L (ref 98–111)
Creatinine, Ser: 1.07 mg/dL (ref 0.61–1.24)
GFR, Estimated: >60 mL/min (ref >60)
Glucose, Bld: 114 mg/dL — ABNORMAL HIGH (ref 70–99)
Potassium: 3.8 mmol/L (ref 3.5–5.1)
Sodium: 138 mmol/L (ref 135–145)

## 2024-08-23 LAB — HEMOGLOBIN A1C
Hgb A1c MFr Bld: 5.4 % (ref 4.8–5.6)
Mean Plasma Glucose: 108.28 mg/dL

## 2024-08-23 MED ORDER — ATORVASTATIN CALCIUM 80 MG PO TABS
80.0000 mg | ORAL_TABLET | Freq: Every day | ORAL | 0 refills | Status: DC
  Filled 2024-08-23: qty 90, 90d supply, fill #0

## 2024-08-23 MED ORDER — PRASUGREL HCL 10 MG PO TABS
10.0000 mg | ORAL_TABLET | Freq: Every day | ORAL | 0 refills | Status: DC
  Filled 2024-08-23: qty 90, 90d supply, fill #0

## 2024-08-23 MED ORDER — LOSARTAN POTASSIUM 25 MG PO TABS
25.0000 mg | ORAL_TABLET | Freq: Every day | ORAL | 2 refills | Status: DC
  Filled 2024-08-23: qty 90, 90d supply, fill #0

## 2024-08-23 MED ORDER — METOPROLOL SUCCINATE ER 25 MG PO TB24
25.0000 mg | ORAL_TABLET | Freq: Every day | ORAL | 2 refills | Status: DC
  Filled 2024-08-23: qty 90, 90d supply, fill #0

## 2024-08-23 MED ORDER — NICOTINE 21 MG/24HR TD PT24
21.0000 mg | MEDICATED_PATCH | Freq: Every day | TRANSDERMAL | 0 refills | Status: DC
  Filled 2024-08-23: qty 28, 28d supply, fill #0

## 2024-08-23 MED ORDER — ASPIRIN 81 MG PO TBEC
81.0000 mg | DELAYED_RELEASE_TABLET | Freq: Every day | ORAL | 0 refills | Status: DC
  Filled 2024-08-23: qty 90, 90d supply, fill #0

## 2024-08-23 MED ORDER — NITROGLYCERIN 0.4 MG SL SUBL
0.4000 mg | SUBLINGUAL_TABLET | SUBLINGUAL | 0 refills | Status: DC | PRN
  Filled 2024-08-23: qty 25, 1d supply, fill #0

## 2024-08-23 NOTE — Plan of Care (Signed)
 Problem: Education: Goal: Knowledge of General Education information will improve Description: Including pain rating scale, medication(s)/side effects and non-pharmacologic comfort measures 08/23/2024 1712 by Harvey Tresia SAUNDERS, RN Outcome: Completed/Met 08/23/2024 1347 by Harvey Tresia SAUNDERS, RN Outcome: Progressing   Problem: Health Behavior/Discharge Planning: Goal: Ability to manage health-related needs will improve 08/23/2024 1712 by Harvey Tresia SAUNDERS, RN Outcome: Completed/Met 08/23/2024 1347 by Harvey Tresia SAUNDERS, RN Outcome: Progressing   Problem: Clinical Measurements: Goal: Ability to maintain clinical measurements within normal limits will improve 08/23/2024 1712 by Harvey Tresia SAUNDERS, RN Outcome: Completed/Met 08/23/2024 1347 by Harvey Tresia SAUNDERS, RN Outcome: Progressing Goal: Will remain free from infection 08/23/2024 1712 by Harvey Tresia SAUNDERS, RN Outcome: Completed/Met 08/23/2024 1347 by Harvey Tresia SAUNDERS, RN Outcome: Progressing Goal: Diagnostic test results will improve 08/23/2024 1712 by Harvey Tresia SAUNDERS, RN Outcome: Completed/Met 08/23/2024 1347 by Harvey Tresia SAUNDERS, RN Outcome: Progressing Goal: Respiratory complications will improve 08/23/2024 1712 by Harvey Tresia SAUNDERS, RN Outcome: Completed/Met 08/23/2024 1347 by Harvey Tresia SAUNDERS, RN Outcome: Progressing Goal: Cardiovascular complication will be avoided 08/23/2024 1712 by Harvey Tresia SAUNDERS, RN Outcome: Completed/Met 08/23/2024 1347 by Harvey Tresia SAUNDERS, RN Outcome: Progressing   Problem: Activity: Goal: Risk for activity intolerance will decrease 08/23/2024 1712 by Harvey Tresia SAUNDERS, RN Outcome: Completed/Met 08/23/2024 1347 by Harvey Tresia SAUNDERS, RN Outcome: Progressing   Problem: Nutrition: Goal: Adequate nutrition will be maintained 08/23/2024 1712 by Harvey Tresia SAUNDERS, RN Outcome: Completed/Met 08/23/2024 1347 by Harvey Tresia SAUNDERS, RN Outcome: Progressing   Problem: Coping: Goal: Level of anxiety will decrease 08/23/2024 1712 by Harvey Tresia SAUNDERS, RN Outcome: Completed/Met 08/23/2024 1347 by Harvey Tresia SAUNDERS, RN Outcome: Progressing   Problem: Elimination: Goal: Will not experience complications related to bowel motility 08/23/2024 1712 by Harvey Tresia SAUNDERS, RN Outcome: Completed/Met 08/23/2024 1347 by Harvey Tresia SAUNDERS, RN Outcome: Progressing Goal: Will not experience complications related to urinary retention 08/23/2024 1712 by Harvey Tresia SAUNDERS, RN Outcome: Completed/Met 08/23/2024 1347 by Harvey Tresia SAUNDERS, RN Outcome: Progressing   Problem: Pain Managment: Goal: General experience of comfort will improve and/or be controlled 08/23/2024 1712 by Harvey Tresia SAUNDERS, RN Outcome: Completed/Met 08/23/2024 1347 by Harvey Tresia SAUNDERS, RN Outcome: Progressing   Problem: Safety: Goal: Ability to remain free from injury will improve 08/23/2024 1712 by Harvey Tresia SAUNDERS, RN Outcome: Completed/Met 08/23/2024 1347 by Harvey Tresia SAUNDERS, RN Outcome: Progressing   Problem: Skin Integrity: Goal: Risk for impaired skin integrity will decrease 08/23/2024 1712 by Harvey Tresia SAUNDERS, RN Outcome: Completed/Met 08/23/2024 1347 by Harvey Tresia SAUNDERS, RN Outcome: Progressing   Problem: Education: Goal: Understanding of CV disease, CV risk reduction, and recovery process will improve 08/23/2024 1712 by Harvey Tresia SAUNDERS, RN Outcome: Completed/Met 08/23/2024 1347 by Harvey Tresia SAUNDERS, RN Outcome: Progressing Goal: Individualized Educational Video(s) 08/23/2024 1712 by Harvey Tresia SAUNDERS, RN Outcome: Completed/Met 08/23/2024 1347 by Harvey Tresia SAUNDERS, RN Outcome: Progressing   Problem: Activity: Goal: Ability to return to baseline activity level will improve 08/23/2024 1712 by Harvey Tresia SAUNDERS, RN Outcome: Completed/Met 08/23/2024 1347 by Harvey Tresia SAUNDERS, RN Outcome: Progressing   Problem: Cardiovascular: Goal: Ability to achieve and maintain adequate cardiovascular perfusion will improve 08/23/2024 1712 by Harvey Tresia SAUNDERS, RN Outcome: Completed/Met 08/23/2024 1347  by Harvey Tresia SAUNDERS, RN Outcome: Progressing Goal: Vascular access site(s) Level 0-1 will be maintained 08/23/2024 1712 by Harvey Tresia SAUNDERS, RN Outcome: Completed/Met 08/23/2024 1347 by Harvey Tresia SAUNDERS, RN Outcome: Progressing   Problem: Health Behavior/Discharge Planning: Goal: Ability to safely manage  health-related needs after discharge will improve 08/23/2024 1712 by Harvey Tresia SAUNDERS, RN Outcome: Completed/Met 08/23/2024 1347 by Harvey Tresia SAUNDERS, RN Outcome: Progressing

## 2024-08-23 NOTE — Plan of Care (Signed)

## 2024-08-23 NOTE — Subjective & Objective (Addendum)
 Pt seen and examined. Girlfriend at bedside. Pt had LHC yesterday for NSTEMI. Had DES placed to mid LAD. Awaiting echo results.

## 2024-08-23 NOTE — TOC CM/SW Note (Signed)
 Transition of Care [facility]) - Inpatient Brief Assessment   Patient Details  Name: Mathew Palmer MRN: [id number] Date of Birth: Oct 31, 1986  Transition of Care [facility]) CM/SW Contact:    Sudie Erminio Deems, RN Phone Number: 08/23/2024, 12:49 PM   Clinical Narrative: Patient presented for chest pain. PTA patient was from home with significant other. Patient has Medicaid and medications are no more than $4.00. Patient states he does not have a PCP and is agreeable for CMA to schedule an appointment. Patient wants to see if [facility] is taking new Medicaid Patients. Patient states he will have transportation to appointments. No further home needs identified at this time. Inpatient Case Manager will continue to follow for additional needs.    Transition of Care Asessment: Insurance and Status: Insurance coverage has been reviewed Patient has primary care physician: No (CMA to schedule appointment and place information on the AVS.) Home environment has been reviewed: reviewed Prior level of function:: independent Prior/Current Home Services: No current home services Social Drivers of Health Review: SDOH reviewed no interventions necessary Readmission risk has been reviewed: Yes Transition of care needs: no transition of care needs at this time

## 2024-08-23 NOTE — Progress Notes (Signed)
 CARDIAC REHAB PHASE I   PRE:  Rate/Rhythm: 52 SB    BP: sitting 124/81    SpO2:   MODE:  Ambulation: 470 ft   POST:  Rate/Rhythm: 72 SR    BP: sitting 124/83     SpO2:    Pt tolerated well, no c/o.  Discussed with pt with significant other MI, stent, restrictions, Effient  importance, diet, exercise, smoking cessation, NTG, and CRPII. Pt receptive. Will refer to AP CRPII. He is not ready to quit smoking but plans to cut down. Gave resources.   [id number]  Aliene Aris BS, ACSM-CEP 08/23/2024 10:40 AM

## 2024-08-23 NOTE — Assessment & Plan Note (Addendum)
 08-23-2024 echo shows LVEF 35-40%. Cardiology going to decide discharge meds for GDMT.  *update. Cardiology has added losartan  25 mg daily and Toprol -XL daily. They will f/u with him in clinic 2 weeks.

## 2024-08-23 NOTE — Assessment & Plan Note (Addendum)
 08-22-2024 Atypical chest pain, but with very high sensitive troponin.  Currently his chest pain is improving but not chest pain free.   Plan to continue anticoagulation with IV heparin  for possible NSTEMI. For now add aspirin  and statin, until further cardiac work up, including cardiac catheterization.  Pain control with oral oxycodone  and IV morphine .  Continue blood pressure monitoring Follow up echocardiogram and further high sensitive troponin.  Check toxicology screen and alcohol level.  ED has contacted Cardiology in [facility] and recommended admission to [facility] with cardiology consultation.    08-23-2024 had LHC yesterday(08-22-2024) with LAD lesions. Had DES placed. Now on ASA, effient  for 12 months, lipitor 80 mg qday. Pt needs to stop smoking and drinking etoh. Had echo. Results pending.

## 2024-08-23 NOTE — Progress Notes (Signed)
 PROGRESS NOTE    Mathew Palmer  FMW:[id number] DOB: 09/03/1986 DOA: 08/22/2024 PCP: Patient, No Pcp Per  Subjective: Pt seen and examined. Girlfriend at bedside. Pt had LHC yesterday for NSTEMI. Had DES placed to mid LAD. Awaiting echo results.   Hospital Course: CC: mid chest pain HPI: Mathew Palmer is a 38 y.o. male with medical history significant of alcohol abuse who presented with chest pain.  Patient was at his usual state of health until yesterday evening around 7:30 pm when he experienced precordial chest pain. Sharp in nature, with no radiation, worse with coughing, no improving factors, no associated dyspnea or diaphoresis. At the time the pain started he was at the park watching his son play. His pain was persistent and worsening reaching up to 8/10 in intensity, his girlfriend noted him to be in pain and brought him to the ED.    In the past he had similar pain mainly associated with coughing. 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 He denies any angina, PND, edema or orthopnea.    He drinks alcohol every day but denies any drug use. At the time of my examination his pain is down to 3 to 4/10.   Significant Events: Admitted 08/22/2024 for NSTEMI 08-22-2024 decision made to transfer pt to [facility] for further evaluation including LHC. IV heparin  started. 08-22-2024 seen by cardiology consult. Dr. Michele felt that pt had NSTEMI and needed LHC today. 08-22-2024 taken to cath lab. 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 Had DES placed to mid LAD. Placed on ASA and Effient .  Admission Labs: WBC 9.8, HgB 15.8, plt 218 Lipase 32 Mg 2.0 Na 133, K 3.8, CO2 of 22, BUN 8, Scr 0.91, glu 109 T. Prot 7.0, alb 3.7, AST 54, ALT 41, alk phos 79, t. Bili 0.7 ESR 1 T. Chol 270, HDL 46, LDL 171, TG 267 UDS positive for THC Covid/rsv/flu negative  Admission Imaging Studies: CXR No active disease   Significant Labs: Troponin I 2728 -> 9659 -> 21047 -> 18505  Significant  Imaging Studies:   Antibiotic Therapy: Anti-infectives (From admission, onward)    None       Procedures: LHC with DES to LAD  Consultants: cardiology    Assessment and Plan: * NSTEMI (non-ST elevated myocardial infarction) (HCC) 08-22-2024 Atypical chest pain, but with very high sensitive troponin.  Currently his chest pain is improving but not chest pain free.   Plan to continue anticoagulation with IV heparin  for possible NSTEMI. For now add aspirin  and statin, until further cardiac work up, including cardiac catheterization.  Pain control with oral oxycodone  and IV morphine .  Continue blood pressure monitoring Follow up echocardiogram and further high sensitive troponin.  Check toxicology screen and alcohol level.  ED has contacted Cardiology in [facility] and recommended admission to [facility] with cardiology consultation.    08-23-2024 had LHC yesterday(08-22-2024) with LAD lesions. Had DES placed. Now on ASA, effient  for 12 months, lipitor 80 mg qday. Pt needs to stop smoking and drinking etoh. Had echo. Results pending.  S/P drug eluting coronary stent placement - 08-22-2024 to LAD for NSTEMI 08-23-2024 pt to stay on ASA/effient  for 12 months continuously. F/u with cards as outpatient. Continue lipitor 80 mg daily.  Continuous dependence on cigarette smoking 08-23-2024 pt advised to stop smoking. Cardiology has prescribed nicotine  patches.  Hyponatremia 08-23-2024 likely due to etoh use. Resolved without incident. Na 136 today.  Alcohol abuse 08-23-2024 placed on CIWA. No seizures or withdrawals.  Pt advised to stop drinking all forms of etoh.  DVT prophylaxis: SCDs Start: 08/22/24 1152    Code Status: Full Code Family Communication: discussed with pt and his girlfriend Disposition Plan: return home Reason for continuing need for hospitalization: medically stable. Awaiting echo.  Objective: Vitals:   08/22/24 2357 08/23/24 0435 08/23/24 0900 08/23/24 1225  BP: 115/69  115/72 115/73 112/79  Pulse: 61 78  61  Resp: 20 18 19 20   Temp: (!) 97.5 F (36.4 C) (!) 97.4 F (36.3 C) 98.2 F (36.8 C) 97.7 F (36.5 C)  TempSrc: Oral Oral Oral Oral  SpO2: 100% 99% 100% 90%  Weight:      Height:       No intake or output data in the 24 hours ending 08/23/24 1320 Filed Weights   08/22/24 0308 08/22/24 1156  Weight: 104.3 kg 108.4 kg    Examination:  Physical Exam Vitals and nursing note reviewed.  HENT:     Head: Normocephalic and atraumatic.     Nose: Nose normal.  Eyes:     General: No scleral icterus. Cardiovascular:     Rate and Rhythm: Normal rate and regular rhythm.  Pulmonary:     Effort: Pulmonary effort is normal.     Breath sounds: Normal breath sounds.  Abdominal:     General: Bowel sounds are normal. There is no distension.     Palpations: Abdomen is soft.  Musculoskeletal:     Right lower leg: No edema.     Left lower leg: No edema.  Skin:    General: Skin is warm and dry.     Capillary Refill: Capillary refill takes less than 2 seconds.  Neurological:     Mental Status: He is alert and oriented to person, place, and time.     Data Reviewed: I have personally reviewed following labs and imaging studies  CBC: Recent Labs  Lab 08/22/24 0310 08/22/24 1248 08/23/24 0540  WBC 9.8 9.1 7.1  NEUTROABS 6.5  --   --   HGB 15.8 16.6 14.8  HCT 45.2 48.3 43.4  MCV 100.2* 101.0* 102.1*  PLT 218 225 181   Basic Metabolic Panel: Recent Labs  Lab 08/22/24 0310 08/22/24 1248 08/23/24 0540  NA 133* 136 138  K 3.8 4.1 3.8  CL 97* 102 105  CO2 22 22 23   GLUCOSE 109* 104* 114*  BUN 8 6 10   CREATININE 0.91 0.98 1.07  CALCIUM  8.7* 9.2 8.6*  MG 2.0  --   --    GFR: Estimated Creatinine Clearance: 125.8 mL/min (by C-G formula based on SCr of 1.07 mg/dL). Liver Function Tests: Recent Labs  Lab 08/22/24 0310  AST 54*  ALT 41  ALKPHOS 79  BILITOT 0.7  PROT 7.0  ALBUMIN 3.7   Recent Labs  Lab 08/22/24 0310  LIPASE 32    HbA1C: Recent Labs    08/23/24 0540  HGBA1C 5.4   Lipid Profile: Recent Labs    08/22/24 1033  CHOL 270*  HDL 46  LDLCALC 171*  TRIG 267*  CHOLHDL 5.9   Recent Results (from the past 240 hours)  Resp panel by RT-PCR (RSV, Flu A&B, Covid) Anterior Nasal Swab     Status: None   Collection Time: 08/22/24 10:20 AM   Specimen: Anterior Nasal Swab  Result Value Ref Range Status   SARS Coronavirus 2 by RT PCR NEGATIVE NEGATIVE Final    Comment: (NOTE) SARS-CoV-2 target nucleic acids are NOT DETECTED.  The SARS-CoV-2 RNA is  generally detectable in upper respiratory specimens during the acute phase of infection. The lowest concentration of SARS-CoV-2 viral copies this assay can detect is 138 copies/mL. A negative result does not preclude SARS-Cov-2 infection and should not be used as the sole basis for treatment or other patient management decisions. A negative result may occur with  improper specimen collection/handling, submission of specimen other than nasopharyngeal swab, presence of viral mutation(s) within the areas targeted by this assay, and inadequate number of viral copies(<138 copies/mL). A negative result must be combined with clinical observations, patient history, and epidemiological information. The expected result is Negative.  Fact Sheet for Patients:  BloggerCourse.com  Fact Sheet for Healthcare Providers:  SeriousBroker.it  This test is no t yet approved or cleared by the [place]  FDA and  has been authorized for detection and/or diagnosis of SARS-CoV-2 by FDA under an Emergency Use Authorization (EUA). This EUA will remain  in effect (meaning this test can be used) for the duration of the COVID-19 declaration under Section 564(b)(1) of the Act, 21 U.S.C.section 360bbb-3(b)(1), unless the authorization is terminated  or revoked sooner.       Influenza A by PCR NEGATIVE NEGATIVE Final   Influenza  B by PCR NEGATIVE NEGATIVE Final    Comment: (NOTE) The Xpert Xpress SARS-CoV-2/FLU/RSV plus assay is intended as an aid in the diagnosis of influenza from Nasopharyngeal swab specimens and should not be used as a sole basis for treatment. Nasal washings and aspirates are unacceptable for Xpert Xpress SARS-CoV-2/FLU/RSV testing.  Fact Sheet for Patients: BloggerCourse.com  Fact Sheet for Healthcare Providers: SeriousBroker.it  This test is not yet approved or cleared by the [place]  FDA and has been authorized for detection and/or diagnosis of SARS-CoV-2 by FDA under an Emergency Use Authorization (EUA). This EUA will remain in effect (meaning this test can be used) for the duration of the COVID-19 declaration under Section 564(b)(1) of the Act, 21 U.S.C. section 360bbb-3(b)(1), unless the authorization is terminated or revoked.     Resp Syncytial Virus by PCR NEGATIVE NEGATIVE Final    Comment: (NOTE) Fact Sheet for Patients: BloggerCourse.com  Fact Sheet for Healthcare Providers: SeriousBroker.it  This test is not yet approved or cleared by the [place]  FDA and has been authorized for detection and/or diagnosis of SARS-CoV-2 by FDA under an Emergency Use Authorization (EUA). This EUA will remain in effect (meaning this test can be used) for the duration of the COVID-19 declaration under Section 564(b)(1) of the Act, 21 U.S.C. section 360bbb-3(b)(1), unless the authorization is terminated or revoked.  Performed at [facility], [address], [place], [place] [address]      Radiology Studies: CARDIAC CATHETERIZATION Result Date: 08/22/2024 Table formatting from the original result was not included. Images from the original result were not included.   CULPRIT LESION: Mid LAD lesion is 90% stenosed between 1st & 2nd Diag branches; TIMI II flow   A drug-eluting stent  was successfully placed using a STENT SYNERGY XD [id number] -postdilated and taper fashion from 3.1 to 3.0 mm.  Post intervention, there is a 0% residual stenosis.  TIMI-3 flow restored Diagnostic; Dominance: Right      Intervention      Left Ventriculogram (1= normal, 2 = hypokinetic)  POST-CATH DIAGNOSES Severe single-vessel disease with 90% thrombotic mid LAD lesion between D1 and D2 Successful DES PCI of the LAD with a 2.5 mm x 24 mm Synergy XD stent postdilated taper fashion from 3.1 to 3.0 mm. Widely patent D1  and D2 as well as LCx was courses mostly as a lateral OM Very large caliber RCA that gives off a large caliber wraparound PDA that courses to cover the distal quarter of the anterior wall, and a posterior AV groove branch gives off a large caliber posterior lateral branch that courses all the way to the apex.. Mild to moderately reduced LVEF of roughly 40% with mid to apical anterior hypokinesis; severely elevated LVEDP of 26 to 28 mmHg.  RECOMMENDATIONS   In the absence of any other complications or medical issues, we expect the patient to be ready for discharge from an interventional cardiology perspective on 08/23/2024.   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   After 1 year ASA/prasugrel  DAPT would stop aspirin  and continue year #2 with clopidogrel 75 mg daily, okay to interrupt after the 1 year) Alm Clay, MD   DG Chest Portable 1 View Result Date: 08/22/2024 CLINICAL DATA:  Chest pain EXAM: PORTABLE CHEST 1 VIEW COMPARISON:  03/05/2023 FINDINGS: Cardiac shadow is stable. The lungs are well aerated bilaterally. No focal infiltrate or effusion is seen. No bony abnormality is noted. IMPRESSION: No active disease.  Electronically Signed   By: Oneil Devonshire M.D.   On: 08/22/2024 03:29    Scheduled Meds:  aspirin  EC  81 mg Oral Daily   atorvastatin   80 mg Oral Daily   folic acid   1 mg Oral Daily   nicotine   21 mg Transdermal Daily   pantoprazole   40 mg Oral BID   prasugrel   10 mg Oral Daily   sodium chloride  flush  3 mL Intravenous Q12H   thiamine  (VITAMIN B1) injection  100 mg Intravenous Daily   Continuous Infusions:   LOS: 1 day   Time spent: 60 minutes  Camellia Door, DO  [facility]  08/23/2024, 1:20 PM

## 2024-08-23 NOTE — Progress Notes (Signed)
  Progress Note  Patient Name: Mathew Palmer Date of Encounter: 08/23/2024 [facility] Cardiologist: None    Interval Summary   Patient reports feeling well this AM. No chest pain or shortness of breath. Right radial cath site stable. Has been walking around his room and has been tolerating activity well   Vital Signs Vitals:   08/22/24 2011 08/22/24 2357 08/23/24 0435 08/23/24 0900  BP: 115/86 115/69 115/72 115/73  Pulse:  61 78   Resp:  20 18 19   Temp: 98.2 F (36.8 C) (!) 97.5 F (36.4 C) (!) 97.4 F (36.3 C) 98.2 F (36.8 C)  TempSrc: Oral Oral Oral Oral  SpO2: 98% 100% 99% 100%  Weight:      Height:       No intake or output data in the 24 hours ending 08/23/24 0931    08/22/2024   11:56 AM 08/22/2024    3:08 AM 03/05/2023    1:12 PM  Last 3 Weights  Weight (lbs) 238 lb 14.4 oz 230 lb 225 lb  Weight (kg) 108.364 kg 104.327 kg 102.059 kg      Telemetry/ECG  NSR - Personally Reviewed  Physical Exam  GEN: No acute distress.  Sitting upright in the bed  Neck: No JVD Cardiac: RRR, no murmurs, rubs, or gallops. Right radial cath site soft, nontender  Respiratory: Clear to auscultation bilaterally. Normal WOB on room air  GI: Soft, nontender, non-distended  MS: No edema in BLE  Assessment & Plan   NSTEMI  - Patient presented with chest pain, hsTn peaked at 22,275. CRP and sed rate normal  - Given patient's young age and significant troponin elevation, he underwent cardiac catheterization yesterday. Found to have 90% mid LAD lesion between 1st and 2nd diagonal branches. Treated with DES.  - Echocardiogram pending today  - Continue uninterrupted DAPT with ASA 81 mg daily and prasugrel  10 mg daily for a minimum of 12 months  - Continue lipitor 80 mg daily  - Arranged close office follow up 9/30  - Right radial cath site stable. Discussed radial site care. Patient works in [employer], provided work note for the next 5 days   HLD  - Lipid panel this  admission with LDL 171, HDL 46, triglycerides 267, total cholesterol 270  - Continue lipitor 80 mg daily (new this admission)  - Needs lipid panel and LFTs in 6-8 weeks   Tobacco Use  - Counseled on tobacco cessation this admission   Otherwise per primary  - Alcohol abuse, on CIWA protocol  - Hyponatremia   For questions or updates, please contact [facility] Please consult [email or web address] for contact info under   I spent 15 minutes seeing this patient. During that time, I reviewed history, evaluated their symptoms, reviewed available labs, reviewed cath, performed exam.       Signed, Rollo FABIENE Louder, PA-C

## 2024-08-23 NOTE — Discharge Instructions (Signed)
 Radial Site Care Refer to this sheet in the next few weeks. These instructions provide you with information on caring for yourself after your procedure. Your caregiver may also give you more specific instructions. Your treatment has been planned according to current medical practices, but problems sometimes occur. Call your caregiver if you have any problems or questions after your procedure. HOME CARE INSTRUCTIONS You may shower the day after the procedure. Remove the bandage (dressing) and gently wash the site with plain soap and water. Gently pat the site dry.  Do not apply powder or lotion to the site.  Do not submerge the affected site in water for 3 to 5 days.  Inspect the site at least twice daily.  Do not flex or bend the affected arm for 24 hours.  No lifting over 5 pounds (2.3 kg) for 5 days after your procedure.  Do not drive home if you are discharged the same day of the procedure. Have someone else drive you.  You may drive 24 hours after the procedure unless otherwise instructed by your caregiver.  What to expect: Any bruising will usually fade within 1 to 2 weeks.  Blood that collects in the tissue (hematoma) may be painful to the touch. It should usually decrease in size and tenderness within 1 to 2 weeks.  SEEK IMMEDIATE MEDICAL CARE IF: You have unusual pain at the radial site.  You have redness, warmth, swelling, or pain at the radial site.  You have drainage (other than a small amount of blood on the dressing).  You have chills.  You have a fever or persistent symptoms for more than 72 hours.  You have a fever and your symptoms suddenly get worse.  Your arm becomes pale, cool, tingly, or numb.  You have heavy bleeding from the site. Hold pressure on the site.     PLEASE DO NOT MISS ANY DOSES OF YOUR EFFIENT !!!!! Also keep a log of you blood pressures and bring back to your follow up appt. Please call the office with any questions.   Patients taking blood thinners should  generally stay away from medicines like ibuprofen , Advil , Motrin , naproxen , and Aleve  due to risk of stomach bleeding. You may take Tylenol  as directed or talk to your primary doctor about alternatives.  PLEASE ENSURE THAT YOU DO NOT RUN OUT OF YOUR EFFIENT . This medication is very important to remain on for at least one year. IF you have issues obtaining this medication due to cost please CALL the office 3-5 business days prior to running out in order to prevent missing doses of this medication.

## 2024-08-23 NOTE — Assessment & Plan Note (Signed)
 08-23-2024 pt advised to stop smoking. Cardiology has prescribed nicotine  patches.

## 2024-08-23 NOTE — Progress Notes (Addendum)
 Discharge instructions given to pt & significant other. Nicotine  patches given to pt from toc. All questions answered. Pt advised & educated on tobacco & alcohol cessation. Pt advised on medications. All of the pt's belongings were gathered & sent with him. Pt's Iv & heart monitor removed & ccmd noitified. Alvina Strother R, RN  pt advised & educated if he happens to smoke to please remove his nicotine  patch.

## 2024-08-23 NOTE — Discharge Summary (Signed)
 [facility] Physician Discharge Summary   Patient name: Mathew Palmer  Admit date:     08/22/2024  Discharge date: 08/23/2024  Attending Physician: RICKY FINES [[id number]]  Discharge Physician: Camellia Door   PCP: Patient, No Pcp Per  Admitted From: Home  Disposition:  Home  Recommendations for Outpatient Follow-up:  Follow up with PCP in 1-2 weeks Follow up with cardiology in 2 weeks  Home Health:No Equipment/Devices: None    Discharge Condition:Stable CODE STATUS:FULL Diet recommendation: Heart Healthy Fluid Restriction: None  Hospital Summary: CC: mid chest pain HPI: Mathew Palmer is a 38 y.o. male with medical history significant of alcohol abuse who presented with chest pain.  Patient was at his usual state of health until yesterday evening around 7:30 pm when he experienced precordial chest pain. Sharp in nature, with no radiation, worse with coughing, no improving factors, no associated dyspnea or diaphoresis. At the time the pain started he was at the park watching his son play. His pain was persistent and worsening reaching up to 8/10 in intensity, his girlfriend noted him to be in pain and brought him to the ED.    In the past he had similar pain mainly associated with coughing. 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 He denies any angina, PND, edema or orthopnea.    He drinks alcohol every day but denies any drug use. At the time of my examination his pain is down to 3 to 4/10.   Significant Events: Admitted 08/22/2024 for NSTEMI 08-22-2024 decision made to transfer pt to [facility] for further evaluation including LHC. IV heparin  started. 08-22-2024 seen by cardiology consult. Dr. Michele felt that pt had NSTEMI and needed LHC today. 08-22-2024 taken to cath lab. 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 Had DES placed to mid LAD. Placed on ASA and Effient .  Admission Labs: WBC 9.8, HgB 15.8, plt 218 Lipase 32 Mg 2.0 Na 133, K 3.8, CO2 of 22, BUN  8, Scr 0.91, glu 109 T. Prot 7.0, alb 3.7, AST 54, ALT 41, alk phos 79, t. Bili 0.7 ESR 1 T. Chol 270, HDL 46, LDL 171, TG 267 UDS positive for THC Covid/rsv/flu negative  Admission Imaging Studies: CXR No active disease   Significant Labs: Troponin I 2728 -> 9659 -> 21047 -> 18505  Significant Imaging Studies: Echo shows LVEF 35-40%  Antibiotic Therapy: Anti-infectives (From admission, onward)    None       Procedures: LHC with DES to LAD  Consultants: cardiology   Hospital Course by Problem: * NSTEMI (non-ST elevated myocardial infarction) (HCC) 08-22-2024 Atypical chest pain, but with very high sensitive troponin.  Currently his chest pain is improving but not chest pain free.   Plan to continue anticoagulation with IV heparin  for possible NSTEMI. For now add aspirin  and statin, until further cardiac work up, including cardiac catheterization.  Pain control with oral oxycodone  and IV morphine .  Continue blood pressure monitoring Follow up echocardiogram and further high sensitive troponin.  Check toxicology screen and alcohol level.  ED has contacted Cardiology in [facility] and recommended admission to [facility] with cardiology consultation.    08-23-2024 had LHC yesterday(08-22-2024) with LAD lesions. Had DES placed. Now on ASA, effient  for 12 months, lipitor 80 mg qday. Pt needs to stop smoking and drinking etoh. Had echo. Results pending.  S/P drug eluting coronary stent placement - 08-22-2024 to LAD for NSTEMI 08-23-2024 pt to stay on ASA/effient  for 12 months continuously. F/u with cards as  outpatient. Continue lipitor 80 mg daily.  Continuous dependence on cigarette smoking 08-23-2024 pt advised to stop smoking. Cardiology has prescribed nicotine  patches.  Hyponatremia 08-23-2024 likely due to etoh use. Resolved without incident. Na 136 today.  Alcohol abuse 08-23-2024 placed on CIWA. No seizures or withdrawals. Pt advised to stop drinking all forms of  etoh.  Ischemic cardiomyopathy 08-23-2024 echo shows LVEF 35-40%. Cardiology going to decide discharge meds for GDMT.  *update. Cardiology has added losartan  25 mg daily and Toprol -XL daily. They will f/u with him in clinic 2 weeks.    Discharge Diagnoses:  Principal Problem:   NSTEMI (non-ST elevated myocardial infarction) (HCC) Active Problems:   S/P drug eluting coronary stent placement - 08-22-2024 to LAD for NSTEMI   Alcohol abuse   Hyponatremia   Continuous dependence on cigarette smoking   Marijuana abuse   Ischemic cardiomyopathy   Discharge Instructions  Discharge Instructions     Amb Referral to Cardiac Rehabilitation   Complete by: As directed    Diagnosis:  Coronary Stents PTCA NSTEMI     After initial evaluation and assessments completed: Virtual Based Care may be provided alone or in conjunction with Phase 2 Cardiac Rehab based on patient barriers.: Yes   Intensive Cardiac Rehabilitation (ICR) [facility] location only OR Traditional Cardiac Rehabilitation (TCR) *If criteria for ICR are not met will enroll in TCR [facility] only): Yes   Call MD for:  difficulty breathing, headache or visual disturbances   Complete by: As directed    Call MD for:  extreme fatigue   Complete by: As directed    Call MD for:  hives   Complete by: As directed    Call MD for:  persistant dizziness or light-headedness   Complete by: As directed    Call MD for:  persistant nausea and vomiting   Complete by: As directed    Call MD for:  redness, tenderness, or signs of infection (pain, swelling, redness, odor or green/yellow discharge around incision site)   Complete by: As directed    Call MD for:  severe uncontrolled pain   Complete by: As directed    Call MD for:  temperature >100.4   Complete by: As directed    Diet - low sodium heart healthy   Complete by: As directed    Discharge instructions   Complete by: As directed    1. Follow up with your primary care provider in 1-2 weeks  following discharge from hospital. 2. Follow up with outpatient cardiology 3. Stop smoking all tobacco products and drinking all forms of alcohol.   Increase activity slowly   Complete by: As directed       Allergies as of 08/23/2024   No Known Allergies      Medication List     TAKE these medications    acetaminophen  500 MG tablet Commonly known as: TYLENOL  Take 500 mg by mouth 2 (two) times daily as needed for fever or headache (pain).   aspirin  EC 81 MG tablet Take 1 tablet (81 mg total) by mouth daily. Swallow whole. Start taking on: August 24, 2024   atorvastatin  80 MG tablet Commonly known as: LIPITOR Take 1 tablet (80 mg total) by mouth daily. Start taking on: August 24, 2024   losartan  25 MG tablet Commonly known as: Cozaar  Take 1 tablet (25 mg total) by mouth daily.   metoprolol  succinate 25 MG 24 hr tablet Commonly known as: Toprol  XL Take 1 tablet (25 mg total) by mouth daily.  naproxen  sodium 220 MG tablet Commonly known as: ALEVE  Take 220 mg by mouth 2 (two) times daily as needed (pain).   nicotine  21 mg/24hr patch Commonly known as: NICODERM CQ  - dosed in mg/24 hours Place 1 patch (21 mg total) onto the skin daily. Start taking on: August 24, 2024   nitroGLYCERIN  0.4 MG SL tablet Commonly known as: NITROSTAT  Place 1 tablet (0.4 mg total) under the tongue every 5 (five) minutes as needed for chest pain.   prasugrel  10 MG Tabs tablet Commonly known as: EFFIENT  Take 1 tablet (10 mg total) by mouth daily. Start taking on: August 24, 2024        Follow-up Information     [facility] Follow up.   Why: OFFICE WILL CALL PT'S WITH APPT and PT'S MAY ALSO CALL OFFICE # [phone] Contact information: [address] [place] [address] PHONE # [phone]               No Known Allergies  Discharge Exam: Vitals:   08/23/24 1225 08/23/24 1502  BP: 112/79 125/87  Pulse: 61    Resp: 20 16  Temp: 97.7 F (36.5 C) 97.6 F (36.4 C)  SpO2: 90% 96%    Physical Exam Vitals and nursing note reviewed.  HENT:     Head: Normocephalic and atraumatic.     Nose: Nose normal.  Eyes:     General: No scleral icterus. Cardiovascular:     Rate and Rhythm: Normal rate and regular rhythm.  Pulmonary:     Effort: Pulmonary effort is normal.     Breath sounds: Normal breath sounds.  Abdominal:     General: Bowel sounds are normal. There is no distension.     Palpations: Abdomen is soft.  Musculoskeletal:     Right lower leg: No edema.     Left lower leg: No edema.  Skin:    General: Skin is warm and dry.     Capillary Refill: Capillary refill takes less than 2 seconds.  Neurological:     Mental Status: He is alert and oriented to person, place, and time.    The results of significant diagnostics from this hospitalization (including imaging, microbiology, ancillary and laboratory) are listed below for reference.    Microbiology: Recent Results (from the past 240 hours)  Resp panel by RT-PCR (RSV, Flu A&B, Covid) Anterior Nasal Swab     Status: None   Collection Time: 08/22/24 10:20 AM   Specimen: Anterior Nasal Swab  Result Value Ref Range Status   SARS Coronavirus 2 by RT PCR NEGATIVE NEGATIVE Final    Comment: (NOTE) SARS-CoV-2 target nucleic acids are NOT DETECTED.  The SARS-CoV-2 RNA is generally detectable in upper respiratory specimens during the acute phase of infection. The lowest concentration of SARS-CoV-2 viral copies this assay can detect is 138 copies/mL. A negative result does not preclude SARS-Cov-2 infection and should not be used as the sole basis for treatment or other patient management decisions. A negative result may occur with  improper specimen collection/handling, submission of specimen other than nasopharyngeal swab, presence of viral mutation(s) within the areas targeted by this assay, and inadequate number of viral copies(<138  copies/mL). A negative result must be combined with clinical observations, patient history, and epidemiological information. The expected result is Negative.  Fact Sheet for Patients:  BloggerCourse.com  Fact Sheet for Healthcare Providers:  SeriousBroker.it  This test is no t yet approved or cleared by the [place]  [place] FDA and  has been authorized for detection and/or diagnosis of SARS-CoV-2 by FDA under an Emergency Use Authorization (EUA). This EUA will remain  in effect (meaning this test can be used) for the duration of the COVID-19 declaration under Section 564(b)(1) of the Act, 21 U.S.C.section 360bbb-3(b)(1), unless the authorization is terminated  or revoked sooner.       Influenza A by PCR NEGATIVE NEGATIVE Final   Influenza B by PCR NEGATIVE NEGATIVE Final    Comment: (NOTE) The Xpert Xpress SARS-CoV-2/FLU/RSV plus assay is intended as an aid in the diagnosis of influenza from Nasopharyngeal swab specimens and should not be used as a sole basis for treatment. Nasal washings and aspirates are unacceptable for Xpert Xpress SARS-CoV-2/FLU/RSV testing.  Fact Sheet for Patients: BloggerCourse.com  Fact Sheet for Healthcare Providers: SeriousBroker.it  This test is not yet approved or cleared by the [place]  FDA and has been authorized for detection and/or diagnosis of SARS-CoV-2 by FDA under an Emergency Use Authorization (EUA). This EUA will remain in effect (meaning this test can be used) for the duration of the COVID-19 declaration under Section 564(b)(1) of the Act, 21 U.S.C. section 360bbb-3(b)(1), unless the authorization is terminated or revoked.     Resp Syncytial Virus by PCR NEGATIVE NEGATIVE Final    Comment: (NOTE) Fact Sheet for Patients: BloggerCourse.com  Fact Sheet for Healthcare  Providers: SeriousBroker.it  This test is not yet approved or cleared by the [place]  FDA and has been authorized for detection and/or diagnosis of SARS-CoV-2 by FDA under an Emergency Use Authorization (EUA). This EUA will remain in effect (meaning this test can be used) for the duration of the COVID-19 declaration under Section 564(b)(1) of the Act, 21 U.S.C. section 360bbb-3(b)(1), unless the authorization is terminated or revoked.  Performed at [facility], [address] [place], [place] [address]      Labs: Basic Metabolic Panel: Recent Labs  Lab 08/22/24 0310 08/22/24 1248 08/23/24 0540  NA 133* 136 138  K 3.8 4.1 3.8  CL 97* 102 105  CO2 22 22 23   GLUCOSE 109* 104* 114*  BUN 8 6 10   CREATININE 0.91 0.98 1.07  CALCIUM  8.7* 9.2 8.6*  MG 2.0  --   --    Liver Function Tests: Recent Labs  Lab 08/22/24 0310  AST 54*  ALT 41  ALKPHOS 79  BILITOT 0.7  PROT 7.0  ALBUMIN 3.7   Recent Labs  Lab 08/22/24 0310  LIPASE 32   CBC: Recent Labs  Lab 08/22/24 0310 08/22/24 1248 08/23/24 0540  WBC 9.8 9.1 7.1  NEUTROABS 6.5  --   --   HGB 15.8 16.6 14.8  HCT 45.2 48.3 43.4  MCV 100.2* 101.0* 102.1*  PLT 218 225 181   Hgb A1c Recent Labs    08/23/24 0540  HGBA1C 5.4   Lipid Profile Recent Labs    08/22/24 1033  CHOL 270*  HDL 46  LDLCALC 171*  TRIG 267*  CHOLHDL 5.9   Sepsis Labs Recent Labs  Lab 08/22/24 0310 08/22/24 1248 08/23/24 0540  WBC 9.8 9.1 7.1    Procedures/Studies: ECHOCARDIOGRAM COMPLETE Result Date: 08/23/2024    ECHOCARDIOGRAM REPORT   Patient Name:   Mathew Palmer Date of Exam: 08/23/2024 Medical Rec #:  [id number]      Height:       75.0 in Accession #:    [id number]     Weight:       238.9 lb Date of  Birth:  01/04/1986      BSA:          2.367 m Patient Age:    38 years       BP:           115/73 mmHg Patient Gender: M              HR:           57 bpm. Exam Location:  Inpatient Procedure: 2D  Echo, 3D Echo, Cardiac Doppler, Color Doppler and Strain Analysis            (Both Spectral and Color Flow Doppler were utilized during            procedure). Indications:    Chest Pain R07.9  History:        Patient has no prior history of Echocardiogram examinations.                 Acute MI, Signs/Symptoms:Chest Pain; Risk Factors:Current                 Smoker.  Sonographer:    Koleen Popper RDCS Sonographer#2:  Wanda Schmitz Referring Phys: 61 DAVID W HARDING  Sonographer Comments: Global longitudinal strain was attempted. IMPRESSIONS  1. Left ventricular ejection fraction, by estimation, is 35 to 40%. The left ventricle has normal function. The left ventricle demonstrates regional wall motion abnormalities consistent with LAD infarct. Left ventricular diastolic parameters were normal. The average left ventricular global longitudinal strain is -11.7 %. The global longitudinal strain is abnormal.  2. Right ventricular systolic function is normal. The right ventricular size is normal.  3. The mitral valve is normal in structure. No evidence of mitral valve regurgitation.  4. The aortic valve is normal in structure. Aortic valve regurgitation is not visualized.  5. Aortic dilatation noted. There is borderline dilatation of the ascending aorta, measuring 37 mm. FINDINGS  Left Ventricle: Left ventricular ejection fraction, by estimation, is 35 to 40%. The left ventricle has normal function. The left ventricle demonstrates regional wall motion abnormalities. The average left ventricular global longitudinal strain is -11.7  %. Strain was performed and the global longitudinal strain is abnormal. The left ventricular internal cavity size was normal in size. There is no left ventricular hypertrophy. Left ventricular diastolic parameters were normal.  LV Wall Scoring: The mid and distal anterior wall, mid anteroseptal segment, apical lateral segment, and apex are hypokinetic. The apical septal segment is normal. Right  Ventricle: The right ventricular size is normal. No increase in right ventricular wall thickness. Right ventricular systolic function is normal. Left Atrium: Left atrial size was normal in size. Right Atrium: Right atrial size was normal in size. Pericardium: There is no evidence of pericardial effusion. Mitral Valve: The mitral valve is normal in structure. No evidence of mitral valve regurgitation. Tricuspid Valve: The tricuspid valve is normal in structure. Tricuspid valve regurgitation is not demonstrated. Aortic Valve: The aortic valve is normal in structure. Aortic valve regurgitation is not visualized. Pulmonic Valve: The pulmonic valve was not well visualized. Pulmonic valve regurgitation is not visualized. Aorta: Aortic dilatation noted. There is borderline dilatation of the ascending aorta, measuring 37 mm. IAS/Shunts: The atrial septum is grossly normal.  LEFT VENTRICLE PLAX 2D LVIDd:         5.32 cm   Diastology LVIDs:         3.72 cm   LV e' medial:    7.94 cm/s LV PW:  0.99 cm   LV E/e' medial:  10.8 LV IVS:        1.14 cm   LV e' lateral:   14.60 cm/s LVOT diam:     2.62 cm   LV E/e' lateral: 5.9 LV SV:         97 LV SV Index:   41        2D Longitudinal Strain LVOT Area:     5.39 cm  2D Strain GLS Avg:     -11.7 %                           3D Volume EF:                          3D EF:        55 %                          LV EDV:       204 ml                          LV ESV:       92 ml                          LV SV:        112 ml RIGHT VENTRICLE             IVC RV S prime:     15.30 cm/s  IVC diam: 2.59 cm TAPSE (M-mode): 2.6 cm LEFT ATRIUM             Index        RIGHT ATRIUM           Index LA diam:        4.07 cm 1.72 cm/m   RA Area:     14.60 cm LA Vol (A2C):   53.8 ml 22.73 ml/m  RA Volume:   40.00 ml  16.90 ml/m LA Vol (A4C):   34.1 ml 14.41 ml/m LA Biplane Vol: 42.5 ml 17.96 ml/m  AORTIC VALVE LVOT Vmax:   84.40 cm/s LVOT Vmean:  51.900 cm/s LVOT VTI:    0.180 m  AORTA Ao Root  diam: 3.92 cm Ao Asc diam:  3.70 cm MITRAL VALVE MV Area (PHT): 2.91 cm    SHUNTS MV Decel Time: 261 msec    Systemic VTI:  0.18 m MV E velocity: 85.90 cm/s  Systemic Diam: 2.62 cm MV A velocity: 50.80 cm/s MV E/A ratio:  1.69 Aditya Sabharwal Electronically signed by Ria Commander Signature Date/Time: 08/23/2024/3:17:02 PM    Final    CARDIAC CATHETERIZATION Result Date: 08/22/2024 Table formatting from the original result was not included. Images from the original result were not included.   CULPRIT LESION: Mid LAD lesion is 90% stenosed between 1st & 2nd Diag branches; TIMI II flow   A drug-eluting stent was successfully placed using a STENT SYNERGY XD 2.50X24 -postdilated and taper fashion from 3.1 to 3.0 mm.  Post intervention, there is a 0% residual stenosis.  TIMI-3 flow restored Diagnostic; Dominance: Right      Intervention      Left Ventriculogram (1= normal, 2 = hypokinetic)  POST-CATH DIAGNOSES Severe single-vessel disease with 90% thrombotic mid LAD lesion between D1 and D2 Successful DES PCI  of the LAD with a 2.5 mm x 24 mm Synergy XD stent postdilated taper fashion from 3.1 to 3.0 mm. Widely patent D1 and D2 as well as LCx was courses mostly as a lateral OM Very large caliber RCA that gives off a large caliber wraparound PDA that courses to cover the distal quarter of the anterior wall, and a posterior AV groove branch gives off a large caliber posterior lateral branch that courses all the way to the apex.. Mild to moderately reduced LVEF of roughly 40% with mid to apical anterior hypokinesis; severely elevated LVEDP of 26 to 28 mmHg.  RECOMMENDATIONS   In the absence of any other complications or medical issues, we expect the patient to be ready for discharge from an interventional cardiology perspective on 08/23/2024.   Patient will need Echocardiogram to better assess EF in order to determine the most appropriate GDMT for combination of CAD and likely mild ICM High-dose statin   Recommend  uninterrupted dual antiplatelet therapy with Aspirin  81mg  daily and Prasugrel  10mg  daily for a minimum of 12 months (ACS-Class I recommendation).   After 1 year ASA/prasugrel  DAPT would stop aspirin  and continue year #2 with clopidogrel 75 mg daily, okay to interrupt after the 1 year) Alm Clay, MD   DG Chest Portable 1 View Result Date: 08/22/2024 CLINICAL DATA:  Chest pain EXAM: PORTABLE CHEST 1 VIEW COMPARISON:  03/05/2023 FINDINGS: Cardiac shadow is stable. The lungs are well aerated bilaterally. No focal infiltrate or effusion is seen. No bony abnormality is noted. IMPRESSION: No active disease. Electronically Signed   By: Oneil Devonshire M.D.   On: 08/22/2024 03:29    Time coordinating discharge: 60 mins  SIGNED:  Camellia Door, DO [facility] 08/23/24, 3:52 PM

## 2024-08-23 NOTE — Assessment & Plan Note (Signed)
 08-23-2024 pt to stay on ASA/effient  for 12 months continuously. F/u with cards as outpatient. Continue lipitor 80 mg daily.

## 2024-08-23 NOTE — Plan of Care (Signed)

## 2024-08-23 NOTE — Progress Notes (Signed)
  Echocardiogram 2D Echocardiogram has been performed.  Koleen KANDICE Popper, RDCS 08/23/2024, 11:56 AM

## 2024-08-24 LAB — LIPOPROTEIN A (LPA): Lipoprotein (a): 32.6 nmol/L — ABNORMAL HIGH (ref <75.0)

## 2024-08-24 NOTE — Progress Notes (Signed)
 Cardiology Office Note   Date:  09/07/2024  ID:  Mathew Palmer, Mathew Palmer May 03, 1986, MRN [id number] PCP: Mathew Palmer CHRISTELLA, FNP   HeartCare Providers Cardiologist:  [name], DO   History of Present Illness Mathew Palmer is a 38 y.o. male with a past medical history of CAD and recent NSTEMI, HFrEF, HLD, tobacco use, alcohol use. Patient recently established with Dr. Large and presents today for a hospital follow up appointment   Patient recently admitted 9/14-9/15/25. Patient presented with chest pain and was found to have elevated troponin consistent with NSTEMI. Troponin peaked at 22,275. Underwent cardiac catheterization on 9/14 that showed a 90% stenosed lesion in the mid LAD between 1st and 2nd diagonal branches. Treated with DES. Echocardiogram on 9/15 showed EF 35-40% with regional wall motion abnormalities, normal RV systolic function, no significant valvular abnormalities.   Today, patient reports that he has been doing very well since getting out of the hospital.  We reviewed his hospital course and he has no questions about his admission.  He denies chest pain or shortness of breath since his discharge.  He has been staying active by walking his dog, working in his yard, and playing with his son outside.  He has not had any chest pain or shortness of breath with exertion.  He reports that he has been tolerating his medications well.  Denies dizziness, syncope, near syncope.  Denies palpitations.  We discussed his reduced EF after his heart attack.  He denies ankle edema or orthopnea.  He has been working to quit smoking.  Was previously smoking 2 packs/day, now down to about 1 pack/day.  He would like to follow-up in  because the office is much closer to his house.  Studies Reviewed Cardiac Studies & Procedures   ______________________________________________________________________________________________ CARDIAC CATHETERIZATION  CARDIAC CATHETERIZATION  08/22/2024  Conclusion Table formatting from the original result was not included. Images from the original result were not included.    CULPRIT LESION: Mid LAD lesion is 90% stenosed between 1st & 2nd Diag branches; TIMI II flow   A drug-eluting stent was successfully placed using a STENT SYNERGY XD 2.50X24 -postdilated and taper fashion from 3.1 to 3.0 mm.  Post intervention, there is a 0% residual stenosis.  TIMI-3 flow restored  Diagnostic; Dominance: Right      Intervention      Left Ventriculogram (1= normal, 2 = hypokinetic)  POST-CATH DIAGNOSES Severe single-vessel disease with 90% thrombotic mid LAD lesion between D1 and D2 Successful DES PCI of the LAD with a 2.5 mm x 24 mm Synergy XD stent postdilated taper fashion from 3.1 to 3.0 mm. Widely patent D1 and D2 as well as LCx was courses mostly as a lateral OM Very large caliber RCA that gives off a large caliber wraparound PDA that courses to cover the distal quarter of the anterior wall, and a posterior AV groove branch gives off a large caliber posterior lateral branch that courses all the way to the apex.. Mild to moderately reduced LVEF of roughly 40% with mid to apical anterior hypokinesis; severely elevated LVEDP of 26 to 28 mmHg.   RECOMMENDATIONS   In the absence of any other complications or medical issues, we expect the patient to be ready for discharge from an interventional cardiology perspective on 08/23/2024.   Patient will need Echocardiogram to better assess EF in order to determine the most appropriate GDMT for combination of CAD and likely mild ICM High-dose statin   Recommend uninterrupted dual antiplatelet  therapy with Aspirin  81mg  daily and Prasugrel  10mg  daily for a minimum of 12 months (ACS-Class I recommendation).   After 1 year ASA/prasugrel  DAPT would stop aspirin  and continue year #2 with clopidogrel 75 mg daily, okay to interrupt after the 1 year)    Alm Clay, MD  Findings Coronary  Findings Diagnostic  Dominance: Right  Left Main Vessel was injected. Vessel is normal in caliber.  Left Anterior Descending Mid LAD lesion is 90% stenosed. Vessel is the culprit lesion. The lesion is type C, located proximal to the major branch, segmental, irregular and heavily thrombotic. Due to the extent of thrombus, Aggrastat  bolus was administered and drip used during the PCI, turned off post PCI  First Diagonal Branch Vessel is small in size.  Second Septal Branch Vessel is small in size.  Left Circumflex  First Obtuse Marginal Branch Vessel is moderate in size.  Left Posterior Atrioventricular Artery Vessel is small in size.  Right Coronary Artery Vessel was injected. Very large Vessel is angiographically normal.  Right Ventricular Branch Vessel is small in size.  Inferior Septal Vessel is small in size.  Right Posterior Atrioventricular Artery Vessel is large in size.  First Right Posterolateral Branch Vessel is large in size.  Second Right Posterolateral Branch Vessel is small in size.  Intervention  Mid LAD lesion Stent Lesion length:  22 mm. CATH VISTA GUIDE 6FR XBLD 3.5 guide catheter was inserted. Lesion crossed with guidewire using a WIRE HI TORQ BMW 190CM. Pre-stent angioplasty was performed using a BALLOON EMERGE MR 2.5X20. Maximum pressure:  12 atm. Inflation time:  20 sec. A drug-eluting stent was successfully placed using a STENT SYNERGY XD 2.50X24. Maximum pressure: 16 atm. Inflation time: 30 sec. Stent strut is well apposed. Postdilated and taper fashion from 3.1 to 3.0 mm Post-stent angioplasty was performed using a BALLOON SAPPHIRE NC24 3.0X18. Maximum pressure:  18 atm. Inflation time:  20 sec.  A second balloon was used, using a BALLOON SAPPHIRE NC24 3.0X18. Maximum pressure:  22 atm. Inflation time:  20 sec. 2 inflations, distal and proximal portion of stents WIRE HI TORQ BMW 190CM was advanced down the 2nd Diag Sidebranch-was removed accident  placed Post-Intervention Lesion Assessment The intervention was successful. Pre-interventional TIMI flow is 2. Post-intervention TIMI flow is 3. Treated lesion length:  24 mm. No complications occurred at this lesion. There is a 0% residual stenosis post intervention.     ECHOCARDIOGRAM  ECHOCARDIOGRAM COMPLETE 08/23/2024  Narrative ECHOCARDIOGRAM REPORT    Patient Name:   Mathew Palmer Date of Exam: 08/23/2024 Medical Rec #:  [id number]      Height:       75.0 in Accession #:    [id number]     Weight:       238.9 lb Date of Birth:  19-Mar-1986      BSA:          2.367 m Patient Age:    38 years       BP:           115/73 mmHg Patient Gender: M              HR:           57 bpm. Exam Location:  Inpatient  Procedure: 2D Echo, 3D Echo, Cardiac Doppler, Color Doppler and Strain Analysis (Both Spectral and Color Flow Doppler were utilized during procedure).  Indications:    Chest Pain R07.9  History:        Patient has  no prior history of Echocardiogram examinations. Acute MI, Signs/Symptoms:Chest Pain; Risk Factors:Current Smoker.  Sonographer:    Koleen Popper RDCS Sonographer#2:  Wanda Schmitz Referring Phys: 63 DAVID W HARDING   Sonographer Comments: Global longitudinal strain was attempted. IMPRESSIONS   1. Left ventricular ejection fraction, by estimation, is 35 to 40%. The left ventricle has normal function. The left ventricle demonstrates regional wall motion abnormalities consistent with LAD infarct. Left ventricular diastolic parameters were normal. The average left ventricular global longitudinal strain is -11.7 %. The global longitudinal strain is abnormal. 2. Right ventricular systolic function is normal. The right ventricular size is normal. 3. The mitral valve is normal in structure. No evidence of mitral valve regurgitation. 4. The aortic valve is normal in structure. Aortic valve regurgitation is not visualized. 5. Aortic dilatation noted. There is  borderline dilatation of the ascending aorta, measuring 37 mm.  FINDINGS Left Ventricle: Left ventricular ejection fraction, by estimation, is 35 to 40%. The left ventricle has normal function. The left ventricle demonstrates regional wall motion abnormalities. The average left ventricular global longitudinal strain is -11.7 %. Strain was performed and the global longitudinal strain is abnormal. The left ventricular internal cavity size was normal in size. There is no left ventricular hypertrophy. Left ventricular diastolic parameters were normal.   LV Wall Scoring: The mid and distal anterior wall, mid anteroseptal segment, apical lateral segment, and apex are hypokinetic. The apical septal segment is normal.  Right Ventricle: The right ventricular size is normal. No increase in right ventricular wall thickness. Right ventricular systolic function is normal.  Left Atrium: Left atrial size was normal in size.  Right Atrium: Right atrial size was normal in size.  Pericardium: There is no evidence of pericardial effusion.  Mitral Valve: The mitral valve is normal in structure. No evidence of mitral valve regurgitation.  Tricuspid Valve: The tricuspid valve is normal in structure. Tricuspid valve regurgitation is not demonstrated.  Aortic Valve: The aortic valve is normal in structure. Aortic valve regurgitation is not visualized.  Pulmonic Valve: The pulmonic valve was not well visualized. Pulmonic valve regurgitation is not visualized.  Aorta: Aortic dilatation noted. There is borderline dilatation of the ascending aorta, measuring 37 mm.  IAS/Shunts: The atrial septum is grossly normal.   LEFT VENTRICLE PLAX 2D LVIDd:         5.32 cm   Diastology LVIDs:         3.72 cm   LV e' medial:    7.94 cm/s LV PW:         0.99 cm   LV E/e' medial:  10.8 LV IVS:        1.14 cm   LV e' lateral:   14.60 cm/s LVOT diam:     2.62 cm   LV E/e' lateral: 5.9 LV SV:         97 LV SV Index:   41         2D Longitudinal Strain LVOT Area:     5.39 cm  2D Strain GLS Avg:     -11.7 %  3D Volume EF: 3D EF:        55 % LV EDV:       204 ml LV ESV:       92 ml LV SV:        112 ml  RIGHT VENTRICLE             IVC RV S prime:     15.30 cm/s  IVC diam: 2.59 cm TAPSE (M-mode): 2.6 cm  LEFT ATRIUM             Index        RIGHT ATRIUM           Index LA diam:        4.07 cm 1.72 cm/m   RA Area:     14.60 cm LA Vol (A2C):   53.8 ml 22.73 ml/m  RA Volume:   40.00 ml  16.90 ml/m LA Vol (A4C):   34.1 ml 14.41 ml/m LA Biplane Vol: 42.5 ml 17.96 ml/m AORTIC VALVE LVOT Vmax:   84.40 cm/s LVOT Vmean:  51.900 cm/s LVOT VTI:    0.180 m  AORTA Ao Root diam: 3.92 cm Ao Asc diam:  3.70 cm  MITRAL VALVE MV Area (PHT): 2.91 cm    SHUNTS MV Decel Time: 261 msec    Systemic VTI:  0.18 m MV E velocity: 85.90 cm/s  Systemic Diam: 2.62 cm MV A velocity: 50.80 cm/s MV E/A ratio:  1.69  Aditya Sabharwal Electronically signed by Ria Commander Signature Date/Time: 08/23/2024/3:17:02 PM    Final          ______________________________________________________________________________________________      Risk Assessment/Calculations           Physical Exam VS:  BP 122/70   Pulse 84   Ht 6' 3 (1.905 m)   Wt 241 lb (109.3 kg)   SpO2 99%   BMI 30.12 kg/m        Wt Readings from Last 3 Encounters:  09/07/24 241 lb (109.3 kg)  09/01/24 243 lb 3.2 oz (110.3 kg)  08/22/24 238 lb 14.4 oz (108.4 kg)    GEN: Well nourished, well developed in no acute distress.  Sitting comfortably on the exam table NECK: No JVD CARDIAC: RRR, no murmurs, rubs, gallops.  Right radial cath site soft, nontender, no bruising or bleeding noted RESPIRATORY:  Clear to auscultation without rales, wheezing or rhonchi.  Normal work breathing on room air ABDOMEN: Soft, non-tender, non-distended EXTREMITIES:  No edema in bilateral lower extremities; No deformity   ASSESSMENT AND PLAN  NSTEMI  CAD   - Admitted 9/14-9/15 with NSTEMI. Trop peaked at  22,275. Cath showed a 90% mid LAD lesion that was treated with DES  - Patient reports he has been doing well since his admission.  Denies chest pain or shortness of breath.  Right radial cath site healed as expected - EKG today continues to show TWI in the anterolateral leads, no significant change when compared to EKG post-cath 9/15  - continue DAPT with ASA 81 mg daily and effient  10 mg daily for a minimum of 12 months uninterrupted.  Patient reports compliance with DAPT - Continue lipitor 80 mg daily  - Continue metoprolol  succinate 25 mg daily   Ischemic Cardiomyopathy  Chronic HFrEF  - Echocardiogram 9/14 showed EF 35-40% with regional wall motion abnormalities  - Patient denies shortness of breath, orthopnea, lower extremity swelling.  Euvolemic on exam today - Continue metoprolol  succinate 25 mg daily, losartan  25 mg daily  - Patient seen by PCP on 9/24.  BMP at that time showed creatinine 0.96 and K4.5 - Start spironolactone 12.5 mg daily - Ordered BMP to be collected in 2 weeks - Messaged prior office team to determine cost of Entresto.  Also asked them to run cost for Jardiance and Farxiga. - Anticipate repeating echo in approx 3 months  HLD  - Lipid panel during recent  admission showed  LDL 171, HDL 46, triglycerides 267, total cholesterol 270. AST 50, ALT 52 on 9/24   - Continue lipitor 80 mg daily  - Needs lipid panel and LFTs in 8 weeks.  Requested 22-month follow-up, instructed patient to be fasting for that appointment to have cholesterol rechecked  Tobacco Use  - Patient was previously smoking 2 packs/day.  Has decreased to 1 pack/day - Encouraged complete tobacco cessation.  Discussed nicotine  patches, gum to help with cravings    Cardiac Rehabilitation Eligibility Assessment  The patient is ready to start cardiac rehabilitation from a cardiac standpoint.       Dispo: Follow up in 2 months in [place] per patient  request   Signed, Rollo FABIENE Louder, PA-C

## 2024-08-27 ENCOUNTER — Other Ambulatory Visit (HOSPITAL_COMMUNITY): Payer: Self-pay

## 2024-09-01 ENCOUNTER — Encounter: Payer: Self-pay | Admitting: Family Medicine

## 2024-09-01 ENCOUNTER — Ambulatory Visit: Payer: Self-pay

## 2024-09-01 VITALS — BP 112/70 | HR 83 | Temp 97.5°F | Ht 75.0 in | Wt 243.2 lb

## 2024-09-01 DIAGNOSIS — I214 Non-ST elevation (NSTEMI) myocardial infarction: Secondary | ICD-10-CM | POA: Diagnosis not present

## 2024-09-01 DIAGNOSIS — I255 Ischemic cardiomyopathy: Secondary | ICD-10-CM

## 2024-09-01 DIAGNOSIS — Z720 Tobacco use: Secondary | ICD-10-CM | POA: Insufficient documentation

## 2024-09-01 DIAGNOSIS — E6609 Other obesity due to excess calories: Secondary | ICD-10-CM | POA: Insufficient documentation

## 2024-09-01 DIAGNOSIS — E782 Mixed hyperlipidemia: Secondary | ICD-10-CM | POA: Diagnosis not present

## 2024-09-01 DIAGNOSIS — Z6830 Body mass index (BMI) 30.0-30.9, adult: Secondary | ICD-10-CM

## 2024-09-01 DIAGNOSIS — Z955 Presence of coronary angioplasty implant and graft: Secondary | ICD-10-CM

## 2024-09-01 DIAGNOSIS — F1010 Alcohol abuse, uncomplicated: Secondary | ICD-10-CM

## 2024-09-01 DIAGNOSIS — E66811 Obesity, class 1: Secondary | ICD-10-CM

## 2024-09-01 DIAGNOSIS — E871 Hypo-osmolality and hyponatremia: Secondary | ICD-10-CM

## 2024-09-01 NOTE — Progress Notes (Signed)
 New Patient Office Visit  Subjective    Patient ID: Mathew Palmer, male    DOB: 1986/09/27  Age: 38 y.o. MRN: [id number]  CC:  Chief Complaint  Patient presents with   Hospitalization Follow-up    HEART ATTACK; 1 STENT PUT IN; D/C FROM [facility] ON 08/22/2024; SCHEDULED FOR FOLLOW UP WITH CARDIOLOGIST ON 09/07/2024    HPI [name] presents to establish care.   History of Present Illness   Mathew Palmer is a 38 year old male who presents for follow-up after hospitalization for a heart attack.  Acute myocardial infarction and post-intervention status - Hospitalized on September 14th for acute myocardial infarction after experiencing chest pain - Underwent cardiac catheterization with stent placement - Echocardiogram demonstrated cardiomegaly - Current medications include aspirin , atorvastatin , Effient , losartan , and metoprolol  - Instructed to continue aspirin  and Effient  for at least one year and atorvastatin  indefinitely - Since discharge, no chest pain, shortness of breath, peripheral edema, headaches, or visual changes  Nicotine  use - Uses nicotine  patches primarily in the evening - Continues to smoke but less frequently since hospitalization  Alcohol use - Switched to non-alcoholic beer since discharge - Previously consumed five to six alcoholic drinks in the evening       Outpatient Encounter Medications as of 09/01/2024  Medication Sig   aspirin  EC 81 MG tablet Take 1 tablet (81 mg total) by mouth daily. Swallow whole.   atorvastatin  (LIPITOR) 80 MG tablet Take 1 tablet (80 mg total) by mouth daily.   losartan  (COZAAR ) 25 MG tablet Take 1 tablet (25 mg total) by mouth daily.   metoprolol  succinate (TOPROL  XL) 25 MG 24 hr tablet Take 1 tablet (25 mg total) by mouth daily.   nicotine  (NICODERM CQ  - DOSED IN MG/24 HOURS) 21 mg/24hr patch Place 1 patch (21 mg total) onto the skin daily.   nitroGLYCERIN  (NITROSTAT ) 0.4 MG SL tablet Place 1 tablet (0.4 mg total)  under the tongue every 5 (five) minutes as needed for chest pain.   prasugrel  (EFFIENT ) 10 MG TABS tablet Take 1 tablet (10 mg total) by mouth daily.   [DISCONTINUED] acetaminophen  (TYLENOL ) 500 MG tablet Take 500 mg by mouth 2 (two) times daily as needed for fever or headache (pain).   [DISCONTINUED] naproxen  sodium (ALEVE ) 220 MG tablet Take 220 mg by mouth 2 (two) times daily as needed (pain).   No facility-administered encounter medications on file as of 09/01/2024.    Past Medical History:  Diagnosis Date   ETOH abuse     Past Surgical History:  Procedure Laterality Date   CORONARY STENT INTERVENTION N/A 08/22/2024   Procedure: CORONARY STENT INTERVENTION;  Surgeon: Anner Alm ORN, MD;  Location: [facility] INVASIVE CV LAB;  Service: Cardiovascular;  Laterality: N/A;   CORONARY/GRAFT ACUTE MI REVASCULARIZATION N/A 08/22/2024   Procedure: Coronary/Graft Acute MI Revascularization;  Surgeon: Anner Alm ORN, MD;  Location: [facility] INVASIVE CV LAB;  Service: Cardiovascular;  Laterality: N/A;   LEFT HEART CATH AND CORONARY ANGIOGRAPHY N/A 08/22/2024   Procedure: LEFT HEART CATH AND CORONARY ANGIOGRAPHY;  Surgeon: Anner Alm ORN, MD;  Location: [facility] INVASIVE CV LAB;  Service: Cardiovascular;  Laterality: N/A;    Family History  Problem Relation Age of Onset   Diabetes Mother    Diabetes Father     Social History   Socioeconomic History   Marital status: Divorced    Spouse name: Not on file   Number of children: Not on file   Years  of education: Not on file   Highest education level: Not on file  Occupational History   Not on file  Tobacco Use   Smoking status: Every Day    Current packs/day: 1.00    Types: Cigarettes   Smokeless tobacco: Never  Substance and Sexual Activity   Alcohol use: Yes   Drug use: Not Currently    Types: Marijuana   Sexual activity: Not on file  Other Topics Concern   Not on file  Social History Narrative   Not on file   Social Drivers of Health    Financial Resource Strain: Not on file  Food Insecurity: Patient Declined (08/22/2024)   Hunger Vital Sign    Worried About Running Out of Food in the Last Year: Patient declined    Ran Out of Food in the Last Year: Patient declined  Transportation Needs: Patient Declined (08/22/2024)   PRAPARE - Administrator, Civil Service (Medical): Patient declined    Lack of Transportation (Non-Medical): Patient declined  Physical Activity: Not on file  Stress: Not on file  Social Connections: Patient Declined (08/22/2024)   Social Connection and Isolation Panel    Frequency of Communication with Friends and Family: Patient declined    Frequency of Social Gatherings with Friends and Family: Patient declined    Attends Religious Services: Patient declined    Database administrator or Organizations: Patient declined    Attends Banker Meetings: Patient declined    Marital Status: Patient declined  Intimate Partner Violence: Patient Declined (08/22/2024)   Humiliation, Afraid, Rape, and Kick questionnaire    Fear of Current or Ex-Partner: Patient declined    Emotionally Abused: Patient declined    Physically Abused: Patient declined    Sexually Abused: Patient declined    ROS As per HPI.     Objective    BP 112/70   Pulse 83   Temp (!) 97.5 F (36.4 C)   Ht 6' 3 (1.905 m)   Wt 243 lb 3.2 oz (110.3 kg)   SpO2 97%   BMI 30.40 kg/m   BP Readings from Last 3 Encounters:  09/01/24 112/70  08/23/24 125/87  03/05/23 127/83    Physical Exam Vitals and nursing note reviewed.  Constitutional:      General: He is not in acute distress.    Appearance: He is obese. He is not ill-appearing, toxic-appearing or diaphoretic.  Eyes:     General: No scleral icterus. Cardiovascular:     Rate and Rhythm: Normal rate and regular rhythm.     Heart sounds: Normal heart sounds. No murmur heard. Pulmonary:     Effort: Pulmonary effort is normal. No respiratory distress.      Breath sounds: Normal breath sounds. No wheezing, rhonchi or rales.  Abdominal:     General: Bowel sounds are normal. There is no distension.     Palpations: Abdomen is soft.     Tenderness: There is no abdominal tenderness. There is no guarding or rebound.  Musculoskeletal:     Cervical back: Neck supple. No rigidity.     Right lower leg: No edema.     Left lower leg: No edema.  Skin:    General: Skin is warm and dry.     Coloration: Skin is not jaundiced.  Neurological:     General: No focal deficit present.     Mental Status: He is alert and oriented to person, place, and time.  Psychiatric:  Mood and Affect: Mood normal.        Behavior: Behavior normal.         Assessment & Plan:   Ashar was seen today for hospitalization follow-up.  Diagnoses and all orders for this visit:  NSTEMI (non-ST elevated myocardial infarction) (HCC) -     CBC with Differential/Platelet -     CMP14+EGFR  S/P drug eluting coronary stent placement - 08-22-2024 to LAD for NSTEMI  Mixed hyperlipidemia  Ischemic cardiomyopathy  Tobacco abuse disorder  Hyponatremia -     CMP14+EGFR  Alcohol abuse -     CBC with Differential/Platelet -     CMP14+EGFR -     Vitamin B12 -     Folate -     Vitamin B1  Class 1 obesity due to excess calories with serious comorbidity and body mass index (BMI) of 30.0 to 30.9 in adult -     TSH  Assessment and Plan    Recent non-ST elevation myocardial infarction (NSTEMI) with stent placement Recent NSTEMI with stent placement nine days ago. No chest pain or shortness of breath since discharge. Managed with dual antiplatelet therapy and statin to prevent recurrence. - Continue aspirin  and Effient  for one one year. - Continue atorvastatin  indefinitely. - Follow-up with cardiology next Tuesday.  Ischemic cardiomyopathy - Continue losartan  and metoprolol . - Monitor blood pressure at home weekly, aiming for less than 130/90 mmHg. - Report  elevated blood pressure readings to the clinic.  Mixed hyperlipidemia Managed with atorvastatin  to control cholesterol levels and prevent further cardiac events. - Continue atorvastatin . - Recheck cholesterol levels in three months unless cardiology has a different plan.  Hyponatremia Sodium level was slightly low during hospitalization. Plan to recheck levels to ensure normalization. - Order lab tests to repeat sodium level - Start daily supplements if B vitamin levels are low.  Tobacco use disorder Continued smoking post-hospitalization, reduced frequency. Using nicotine  patches primarily in the evening. - Continue using nicotine  patches. - Encourage further reduction in smoking.  Alcohol use disorder, in early remission Currently consuming non-alcoholic beer. Previously consumed 5-6 alcoholic drinks in the evening before hospitalization. - Continue abstaining from alcohol. - Monitor labs     Obesity Diet, exercise, weight loss.   Return in about 3 months (around 12/01/2024) for chronic follow up.   The patient indicates understanding of these issues and agrees with the plan.  Annabella CHRISTELLA Search, FNP

## 2024-09-02 ENCOUNTER — Encounter: Payer: Self-pay | Admitting: Family Medicine

## 2024-09-02 ENCOUNTER — Ambulatory Visit: Payer: Self-pay | Admitting: Family Medicine

## 2024-09-03 ENCOUNTER — Encounter: Payer: Self-pay | Admitting: Family Medicine

## 2024-09-04 LAB — CBC WITH DIFFERENTIAL/PLATELET
Basophils Absolute: 0.1 x10E3/uL (ref 0.0–0.2)
Basos: 1 %
EOS (ABSOLUTE): 0.2 x10E3/uL (ref 0.0–0.4)
Eos: 2 %
Hematocrit: 49.7 % (ref 37.5–51.0)
Hemoglobin: 16.9 g/dL (ref 13.0–17.7)
Immature Grans (Abs): 0 x10E3/uL (ref 0.0–0.1)
Immature Granulocytes: 0 %
Lymphocytes Absolute: 2.3 x10E3/uL (ref 0.7–3.1)
Lymphs: 26 %
MCH: 35.1 pg — ABNORMAL HIGH (ref 26.6–33.0)
MCHC: 34 g/dL (ref 31.5–35.7)
MCV: 103 fL — ABNORMAL HIGH (ref 79–97)
Monocytes Absolute: 0.8 x10E3/uL (ref 0.1–0.9)
Monocytes: 10 %
Neutrophils Absolute: 5.5 x10E3/uL (ref 1.4–7.0)
Neutrophils: 61 %
Platelets: 268 x10E3/uL (ref 150–450)
RBC: 4.82 x10E6/uL (ref 4.14–5.80)
RDW: 13.1 % (ref 11.6–15.4)
WBC: 8.9 x10E3/uL (ref 3.4–10.8)

## 2024-09-04 LAB — CMP14+EGFR
ALT: 52 IU/L — ABNORMAL HIGH (ref 0–44)
AST: 50 IU/L — ABNORMAL HIGH (ref 0–40)
Albumin: 4.8 g/dL (ref 4.1–5.1)
Alkaline Phosphatase: 108 IU/L (ref 47–123)
BUN/Creatinine Ratio: 7 — ABNORMAL LOW (ref 9–20)
BUN: 7 mg/dL (ref 6–20)
Bilirubin Total: 0.4 mg/dL (ref 0.0–1.2)
CO2: 18 mmol/L — ABNORMAL LOW (ref 20–29)
Calcium: 9.8 mg/dL (ref 8.7–10.2)
Chloride: 102 mmol/L (ref 96–106)
Creatinine, Ser: 0.96 mg/dL (ref 0.76–1.27)
Globulin, Total: 2.2 g/dL (ref 1.5–4.5)
Glucose: 104 mg/dL — ABNORMAL HIGH (ref 70–99)
Potassium: 4.5 mmol/L (ref 3.5–5.2)
Sodium: 137 mmol/L (ref 134–144)
Total Protein: 7 g/dL (ref 6.0–8.5)
eGFR: 104 mL/min/1.73 (ref >59)

## 2024-09-04 LAB — FOLATE: Folate: 3.1 ng/mL (ref >3.0)

## 2024-09-04 LAB — VITAMIN B1: Thiamine: 161.7 nmol/L (ref 66.5–200.0)

## 2024-09-04 LAB — TSH: TSH: 1.58 u[IU]/mL (ref 0.450–4.500)

## 2024-09-04 LAB — VITAMIN B12: Vitamin B-12: 341 pg/mL (ref 232–1245)

## 2024-09-07 ENCOUNTER — Telehealth (HOSPITAL_COMMUNITY): Payer: Self-pay | Admitting: Pharmacy Technician

## 2024-09-07 ENCOUNTER — Encounter: Payer: Self-pay | Admitting: Family Medicine

## 2024-09-07 ENCOUNTER — Encounter: Payer: Self-pay | Admitting: Cardiology

## 2024-09-07 ENCOUNTER — Other Ambulatory Visit (HOSPITAL_COMMUNITY): Payer: Self-pay

## 2024-09-07 ENCOUNTER — Other Ambulatory Visit: Payer: Self-pay

## 2024-09-07 ENCOUNTER — Ambulatory Visit: Attending: Cardiology | Admitting: Cardiology

## 2024-09-07 VITALS — BP 122/70 | HR 84 | Ht 75.0 in | Wt 241.0 lb

## 2024-09-07 DIAGNOSIS — E782 Mixed hyperlipidemia: Secondary | ICD-10-CM

## 2024-09-07 DIAGNOSIS — Z955 Presence of coronary angioplasty implant and graft: Secondary | ICD-10-CM | POA: Insufficient documentation

## 2024-09-07 DIAGNOSIS — I214 Non-ST elevation (NSTEMI) myocardial infarction: Secondary | ICD-10-CM

## 2024-09-07 DIAGNOSIS — Z720 Tobacco use: Secondary | ICD-10-CM

## 2024-09-07 DIAGNOSIS — I255 Ischemic cardiomyopathy: Secondary | ICD-10-CM | POA: Diagnosis not present

## 2024-09-07 MED ORDER — SPIRONOLACTONE 25 MG PO TABS: 12.5000 mg | ORAL_TABLET | Freq: Every day | ORAL | 3 refills | Status: DC

## 2024-09-07 NOTE — Telephone Encounter (Signed)
-----   Message from Rollo FABIENE Louder sent at 09/07/2024  9:06 AM EDT ----- Regarding: Sim / Doreen / Jardiance cost Good morning!   This patient has ischemic heart failure with EF 35-40%.  What would cost him to be on Entresto 24/26 mg BID?  can you also run the cost for Jardiance and Farxiga?   Thanks! KJ

## 2024-09-07 NOTE — Patient Instructions (Signed)
 Medication Instructions:  Start Spironolactone 12.5 mg take one half tablet daily *If you need a refill on your cardiac medications before your next appointment, please call your pharmacy*  Lab Work: BMET in two weeks If you have labs (blood work) drawn today and your tests are completely normal, you will receive your results only by: MyChart Message (if you have MyChart) OR A paper copy in the mail If you have any lab test that is abnormal or we need to change your treatment, we will call you to review the results.  Follow-Up: At [facility], you and your health needs are our priority.  As part of our continuing mission to provide you with exceptional heart care, our providers are all part of one team.  This team includes your primary Cardiologist (physician) and Advanced Practice Providers or APPs (Physician Assistants and Nurse Practitioners) who all work together to provide you with the care you need, when you need it.  Your next appointment:   2 month(s)  Provider:   Tinnie APP

## 2024-09-07 NOTE — Telephone Encounter (Signed)
 Pharmacy Patient Advocate Encounter  Insurance verification completed.   The patient is insured through [facility] MEDICAID   Ran test claim for Entresto, Jardiance, & Farxiga. All 3 need a PA, but copay should be $0-$4  This test claim was processed through [facility]- copay amounts may vary at other pharmacies due to pharmacy/plan contracts, or as the patient moves through the different stages of their insurance plan.

## 2024-09-08 ENCOUNTER — Other Ambulatory Visit (HOSPITAL_COMMUNITY): Payer: Self-pay

## 2024-09-08 ENCOUNTER — Telehealth: Payer: Self-pay

## 2024-09-08 MED ORDER — NICOTINE 21 MG/24HR TD PT24: 21.0000 mg | MEDICATED_PATCH | Freq: Every day | TRANSDERMAL | 2 refills | Status: AC

## 2024-09-08 NOTE — Telephone Encounter (Signed)
 Pharmacy Patient Advocate Encounter   Received notification from Physician's Office that prior authorization for FARXIGA is required/requested.   Insurance verification completed.   The patient is insured through [facility].   Per test claim: PA required; PA submitted to above mentioned insurance via Latent Key/confirmation #/EOC [id number] Status is pending

## 2024-09-08 NOTE — Telephone Encounter (Signed)
 Pharmacy Patient Advocate Encounter  Received notification from [facility] that Prior Authorization for DOREEN has been APPROVED from 09/08/24 to 09/08/25

## 2024-09-08 NOTE — Telephone Encounter (Signed)
-----   Message from Mathew Palmer sent at 09/07/2024  3:44 PM EDT ----- Regarding: RE: Sim / Doreen / Jardiance cost Farxiga please!  Thanks KJ ----- Message ----- From: Mathew Palmer, CPhT Sent: 09/07/2024  11:01 AM EDT To: Mathew Palmer Louder, PA-C Subject: RE: Sim / Farxiga / Jardiance cost        Prior auth required for all. Which would you prefer? ----- Message ----- From: Palmer Mathew JONELLE, PA-C Sent: 09/07/2024   9:07 AM EDT To: Rx Prior Auth Team Subject: Entresto / Farxiga / Jardiance cost            Good morning!   This patient has ischemic heart failure with EF 35-40%.  What would cost him to be on Entresto 24/26 mg BID?  can you also run the cost for Jardiance and Farxiga?   Thanks! KJ

## 2024-09-10 ENCOUNTER — Telehealth: Payer: Self-pay | Admitting: Cardiology

## 2024-09-10 DIAGNOSIS — I255 Ischemic cardiomyopathy: Secondary | ICD-10-CM

## 2024-09-10 MED ORDER — DAPAGLIFLOZIN PROPANEDIOL 10 MG PO TABS: 10.0000 mg | ORAL_TABLET | Freq: Every day | ORAL | 11 refills | Status: DC

## 2024-09-10 NOTE — Telephone Encounter (Signed)
 Notified that prior authorization for Farxiga has been approved. Called patient and discussed risks and benefits of starting Farxiga. Patient agreeable to starting medication.   Sent in Farxiga 10 mg daily to patient's requested pharmacy. Patient is scheduled for repeat BMP in 2 weeks.   Rollo FABIENE Louder, PA-C 09/10/2024 12:51 PM

## 2024-09-16 ENCOUNTER — Telehealth: Payer: Self-pay

## 2024-09-16 NOTE — Telephone Encounter (Signed)
 I spoke to pt's SO (on DPR) and advised pt doesn't have to be fasting.

## 2024-09-16 NOTE — Telephone Encounter (Signed)
 Copied from CRM [id number]. Topic: Clinical - Medical Advice >> Sep 16, 2024  1:06 PM Antwanette L wrote: Reason for CRM: Virginia , the patient's girlfriend, called to ask whether fasting is required for the patient's upcoming basic metabolic panel scheduled for September 17, 2024. She is requesting a callback at [phone] with clarification

## 2024-09-17 ENCOUNTER — Other Ambulatory Visit

## 2024-09-17 DIAGNOSIS — I255 Ischemic cardiomyopathy: Secondary | ICD-10-CM | POA: Diagnosis not present

## 2024-09-17 DIAGNOSIS — I214 Non-ST elevation (NSTEMI) myocardial infarction: Secondary | ICD-10-CM | POA: Diagnosis not present

## 2024-09-17 DIAGNOSIS — E782 Mixed hyperlipidemia: Secondary | ICD-10-CM | POA: Diagnosis not present

## 2024-09-17 DIAGNOSIS — Z955 Presence of coronary angioplasty implant and graft: Secondary | ICD-10-CM | POA: Diagnosis not present

## 2024-09-18 ENCOUNTER — Ambulatory Visit: Payer: Self-pay | Admitting: Cardiology

## 2024-09-18 LAB — BASIC METABOLIC PANEL WITH GFR
BUN/Creatinine Ratio: 10 (ref 9–20)
BUN: 10 mg/dL (ref 6–20)
CO2: 20 mmol/L (ref 20–29)
Calcium: 9.7 mg/dL (ref 8.7–10.2)
Chloride: 103 mmol/L (ref 96–106)
Creatinine, Ser: 1.03 mg/dL (ref 0.76–1.27)
Glucose: 85 mg/dL (ref 70–99)
Potassium: 4.5 mmol/L (ref 3.5–5.2)
Sodium: 137 mmol/L (ref 134–144)
eGFR: 96 mL/min/1.73 (ref >59)

## 2024-11-03 NOTE — Progress Notes (Unsigned)
 Cardiology Office Note    Date:  11/10/2024  ID:  Mathew Palmer, DOB 1986/08/21, MRN [id number] Cardiologist: [name], DO --> patient has requested to follow-up in [facility]  History of Present Illness:    Mathew Palmer is a 38 y.o. male with past medical history of CAD (s/p NSTEMI in 08/2024 with DES to mid-LAD), chronic HFrEF (EF 35-40% by echo in 08/2024), alcohol use and tobacco use who presents to the office today for 61-month follow-up.   He was examined by Rollo Louder, PA in 08/2024 following his recent admission for an NSTEMI. He denied any recurrent anginal symptoms and did report compliance with his medical therapy. He was continued on ASA 81 mg daily, Effient  10 mg daily, Lipitor 80 mg daily, Toprol -XL 25 mg daily and Losartan  25 mg daily. Was started on Spironolactone  12.5 mg daily. Was also ultimately started on Farxiga  10 mg daily once this was approved by his insurance.  In talking with the patient and his significant other today, he reports overall doing well since his last office visit. 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 Denies any exertional chest pain or progressive dyspnea on exertion with this. He has reduced his tobacco use to approximately 1 pack/day and was previously smoking 2 packs/day. No recent orthopnea, PND or lower extremity edema. Reports good compliance with his current medications. He is on ASA and Effient  with no reports of active bleeding.  Studies Reviewed:   EKG: EKG is not ordered today.  Cardiac Catheterization: 08/2024   CULPRIT LESION: Mid LAD lesion is 90% stenosed between 1st & 2nd Diag branches; TIMI II flow   A drug-eluting stent was successfully placed using a STENT SYNERGY XD 2.50X24 -postdilated and taper fashion from 3.1 to 3.0 mm.  Post intervention, there is a 0% residual stenosis.  TIMI-3 flow restored   Diagnostic; Dominance: Right                                                                Intervention                                                        Left Ventriculogram (1= normal, 2 = hypokinetic)             POST-CATH DIAGNOSES Severe single-vessel disease with 90% thrombotic mid LAD lesion between D1 and D2 Successful DES PCI of the LAD with a 2.5 mm x 24 mm Synergy XD stent postdilated taper fashion from 3.1 to 3.0 mm.  Widely patent D1 and D2 as well as LCx was courses mostly as a lateral OM Very large caliber RCA that gives off a large caliber wraparound PDA that courses to cover the distal quarter of the anterior wall, and a posterior AV groove branch gives off a large caliber posterior lateral branch that courses all the way to the apex.. Mild to moderately reduced LVEF of roughly 40% with mid to apical anterior hypokinesis; severely elevated LVEDP of 26 to 28 mmHg.       RECOMMENDATIONS   In the absence of  any other complications or medical issues, we expect the patient to be ready for discharge from an interventional cardiology perspective on 08/23/2024.   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   After 1 year ASA/prasugrel  DAPT would stop aspirin  and continue year #2 with clopidogrel 75 mg daily, okay to interrupt after the 1 year)  Echocardiogram: 08/2024 IMPRESSIONS     1. Left ventricular ejection fraction, by estimation, is 35 to 40%. The  left ventricle has normal function. The left ventricle demonstrates  regional wall motion abnormalities consistent with LAD infarct. Left  ventricular diastolic parameters were  normal. The average left ventricular global longitudinal strain is -11.7  %. The global longitudinal strain is abnormal.   2. Right ventricular systolic function is  normal. The right ventricular  size is normal.   3. The mitral valve is normal in structure. No evidence of mitral valve  regurgitation.   4. The aortic valve is normal in structure. Aortic valve regurgitation is  not visualized.   5. Aortic dilatation noted. There is borderline dilatation of the  ascending aorta, measuring 37 mm.     Physical Exam:   VS:  BP 114/82 (BP Location: Left Arm, Cuff Size: Large)   Pulse 78   Ht 6' 3 (1.905 m)   Wt 237 lb (107.5 kg)   SpO2 96%   BMI 29.62 kg/m    Wt Readings from Last 3 Encounters:  11/10/24 237 lb (107.5 kg)  09/07/24 241 lb (109.3 kg)  09/01/24 243 lb 3.2 oz (110.3 kg)     GEN: Well nourished, well developed male appearing in no acute distress NECK: No JVD; No carotid bruits CARDIAC: RRR, no murmurs, rubs, gallops RESPIRATORY:  Clear to auscultation without rales, wheezing or rhonchi  ABDOMEN: Appears non-distended. No obvious abdominal masses. EXTREMITIES: No clubbing or cyanosis. No pitting edema.  Distal pedal pulses are 2+ bilaterally.   Assessment and Plan:   1. Coronary artery disease involving native coronary artery of native heart without angina pectoris - He previously had an NSTEMI in 08/2024 with DES placement to the mid-LAD as discussed above. He remains active at baseline and denies any recent anginal symptoms. - Continue current medical therapy with ASA 81 mg daily, Effient  10 mg daily, Atorvastatin  80 mg daily and Toprol -XL 25 mg daily  2. Ischemic cardiomyopathy - His EF was reduced at 35 to 40% in 08/2024 at the time of his NSTEMI. Will arrange for a follow-up limited echocardiogram for reassessment of his EF. He appears euvolemic by examination today and denies any recent respiratory issues. - Continue current medical therapy with Farxiga  10 mg daily, Losartan  25 mg daily, Toprol -XL 25 mg daily and Spironolactone  12.5 mg daily. Creatinine was stable at 1.03 when checked in 09/2024.  3. Mixed  hyperlipidemia - FLP in 08/2024 showed total cholesterol 270, triglycerides 267, HDL 46 and LDL 171. He was started on Atorvastatin  80 mg daily at that time. Will recheck an FLP and LFT's. He prefers to have this drawn at the time when he follows up with his PCP later this month and lab slips were provided.  4. Tobacco Use - He continues to smoke approximately 1 pack/day. Cessation advised.  Signed, Laymon CHRISTELLA Qua, PA-C

## 2024-11-10 ENCOUNTER — Ambulatory Visit: Attending: Student | Admitting: Student

## 2024-11-10 ENCOUNTER — Encounter: Payer: Self-pay | Admitting: Student

## 2024-11-10 VITALS — BP 114/82 | HR 78 | Ht 75.0 in | Wt 237.0 lb

## 2024-11-10 DIAGNOSIS — Z79899 Other long term (current) drug therapy: Secondary | ICD-10-CM | POA: Diagnosis not present

## 2024-11-10 DIAGNOSIS — I255 Ischemic cardiomyopathy: Secondary | ICD-10-CM | POA: Insufficient documentation

## 2024-11-10 DIAGNOSIS — I2510 Atherosclerotic heart disease of native coronary artery without angina pectoris: Secondary | ICD-10-CM | POA: Insufficient documentation

## 2024-11-10 DIAGNOSIS — Z720 Tobacco use: Secondary | ICD-10-CM | POA: Insufficient documentation

## 2024-11-10 DIAGNOSIS — E782 Mixed hyperlipidemia: Secondary | ICD-10-CM | POA: Diagnosis not present

## 2024-11-10 MED ORDER — ASPIRIN 81 MG PO TBEC: 81.0000 mg | DELAYED_RELEASE_TABLET | Freq: Every day | ORAL | 3 refills | Status: AC

## 2024-11-10 MED ORDER — SPIRONOLACTONE 25 MG PO TABS: 12.5000 mg | ORAL_TABLET | Freq: Every day | ORAL | 3 refills | Status: AC

## 2024-11-10 MED ORDER — METOPROLOL SUCCINATE ER 25 MG PO TB24: 25.0000 mg | ORAL_TABLET | Freq: Every day | ORAL | 3 refills | Status: AC

## 2024-11-10 MED ORDER — LOSARTAN POTASSIUM 25 MG PO TABS: 25.0000 mg | ORAL_TABLET | Freq: Every day | ORAL | 3 refills | Status: AC

## 2024-11-10 MED ORDER — PRASUGREL HCL 10 MG PO TABS: 10.0000 mg | ORAL_TABLET | Freq: Every day | ORAL | 3 refills | Status: AC

## 2024-11-10 MED ORDER — DAPAGLIFLOZIN PROPANEDIOL 10 MG PO TABS: 10.0000 mg | ORAL_TABLET | Freq: Every day | ORAL | 3 refills | Status: AC

## 2024-11-10 MED ORDER — NITROGLYCERIN 0.4 MG SL SUBL: 0.4000 mg | SUBLINGUAL_TABLET | SUBLINGUAL | 3 refills | Status: AC | PRN

## 2024-11-10 MED ORDER — ATORVASTATIN CALCIUM 80 MG PO TABS: 80.0000 mg | ORAL_TABLET | Freq: Every day | ORAL | 3 refills | Status: AC

## 2024-11-10 NOTE — Patient Instructions (Signed)
 Medication Instructions:  Your physician recommends that you continue on your current medications as directed. Please refer to the Current Medication list given to you today.  *If you need a refill on your cardiac medications before your next appointment, please call your pharmacy*  Lab Work: Your physician recommends that you return for lab work. [facility] ( [place])   If you have labs (blood work) drawn today and your tests are completely normal, you will receive your results only by: MyChart Message (if you have MyChart) OR A paper copy in the mail If you have any lab test that is abnormal or we need to change your treatment, we will call you to review the results.  Testing/Procedures: Your physician has requested that you have an echocardiogram. Echocardiography is a painless test that uses sound waves to create images of your heart. It provides your doctor with information about the size and shape of your heart and how well your heart's chambers and valves are working. This procedure takes approximately one hour. There are no restrictions for this procedure. Please do NOT wear cologne, perfume, aftershave, or lotions (deodorant is allowed). Please arrive 15 minutes prior to your appointment time.  Please note: We ask at that you not bring children with you during ultrasound (echo/ vascular) testing. Due to room size and safety concerns, children are not allowed in the ultrasound rooms during exams. Our front office staff cannot provide observation of children in our lobby area while testing is being conducted. An adult accompanying a patient to their appointment will only be allowed in the ultrasound room at the discretion of the ultrasound technician under special circumstances. We apologize for any inconvenience.   Follow-Up: At [facility], you and your health needs are our priority.  As part of our continuing mission to provide you with exceptional heart care, our providers are  all part of one team.  This team includes your primary Cardiologist (physician) and Advanced Practice Providers or APPs (Physician Assistants and Nurse Practitioners) who all work together to provide you with the care you need, when you need it.  Your next appointment:   6 month(s)  Provider:   Laymon Qua, PA-C    We recommend signing up for the patient portal called MyChart.  Sign up information is provided on this After Visit Summary.  MyChart is used to connect with patients for Virtual Visits (Telemedicine).  Patients are able to view lab/test results, encounter notes, upcoming appointments, etc.  Non-urgent messages can be sent to your provider as well.   To learn more about what you can do with MyChart, go to [email or web address].   Other Instructions Thank you for choosing [facility]!

## 2024-12-03 ENCOUNTER — Ambulatory Visit: Payer: Self-pay | Admitting: Student

## 2024-12-03 ENCOUNTER — Ambulatory Visit (HOSPITAL_COMMUNITY)
Admission: RE | Admit: 2024-12-03 | Discharge: 2024-12-03 | Disposition: A | Discharge status: Home / Self Care | Source: Ambulatory Visit | Attending: Student | Admitting: Student

## 2024-12-03 DIAGNOSIS — I255 Ischemic cardiomyopathy: Secondary | ICD-10-CM | POA: Insufficient documentation

## 2024-12-03 LAB — ECHOCARDIOGRAM LIMITED
Calc EF: 64.2 %
S' Lateral: 3.3 cm
Single Plane A2C EF: 70.3 %
Single Plane A4C EF: 59.5 %

## 2024-12-03 NOTE — Progress Notes (Signed)
*  PRELIMINARY RESULTS* Echocardiogram Limited 2-D Echocardiogram has been performed.  Mathew Palmer 12/03/2024, 12:39 PM

## 2024-12-06 ENCOUNTER — Ambulatory Visit: Payer: Self-pay | Admitting: Family Medicine
